# Patient Record
Sex: Female | Born: 1975 | Race: White | Hispanic: No | Marital: Married | Smoking: Former smoker
Health system: Southern US, Community
[De-identification: ages and names within clinical notes are randomized; demographics above are authoritative.]

## PROBLEM LIST (undated history)

## (undated) DIAGNOSIS — N63 Unspecified lump in unspecified breast: Secondary | ICD-10-CM

## (undated) DIAGNOSIS — N6009 Solitary cyst of unspecified breast: Secondary | ICD-10-CM

## (undated) DIAGNOSIS — K649 Unspecified hemorrhoids: Secondary | ICD-10-CM

## (undated) DIAGNOSIS — F419 Anxiety disorder, unspecified: Secondary | ICD-10-CM

## (undated) DIAGNOSIS — I10 Essential (primary) hypertension: Secondary | ICD-10-CM

## (undated) DIAGNOSIS — Z780 Asymptomatic menopausal state: Secondary | ICD-10-CM

## (undated) DIAGNOSIS — N809 Endometriosis, unspecified: Secondary | ICD-10-CM

## (undated) DIAGNOSIS — G43909 Migraine, unspecified, not intractable, without status migrainosus: Secondary | ICD-10-CM

## (undated) HISTORY — DX: Unspecified hemorrhoids: K64.9

## (undated) HISTORY — DX: Unspecified lump in unspecified breast: N63.0

## (undated) HISTORY — DX: Anxiety disorder, unspecified: F41.9

## (undated) HISTORY — DX: Endometriosis, unspecified: N80.9

## (undated) HISTORY — DX: Migraine, unspecified, not intractable, without status migrainosus: G43.909

## (undated) HISTORY — DX: Asymptomatic menopausal state: Z78.0

## (undated) HISTORY — DX: Solitary cyst of unspecified breast: N60.09

## (undated) HISTORY — DX: Essential (primary) hypertension: I10

---

## 1993-09-15 HISTORY — PX: MANDIBLE FRACTURE SURGERY: SHX706

## 2004-09-15 HISTORY — PX: TOTAL ABDOMINAL HYSTERECTOMY W/ BILATERAL SALPINGOOPHORECTOMY: SHX83

## 2004-09-15 HISTORY — PX: ABDOMINAL HYSTERECTOMY: SHX81

## 2004-09-15 HISTORY — PX: LAPAROSCOPY: SHX197

## 2004-10-22 ENCOUNTER — Ambulatory Visit: Payer: Self-pay | Admitting: Unknown Physician Specialty

## 2005-01-16 ENCOUNTER — Inpatient Hospital Stay: Payer: Self-pay | Admitting: Unknown Physician Specialty

## 2005-09-30 ENCOUNTER — Ambulatory Visit: Payer: Self-pay

## 2007-02-02 ENCOUNTER — Ambulatory Visit: Payer: Self-pay | Admitting: Family Medicine

## 2007-07-21 ENCOUNTER — Ambulatory Visit: Payer: Self-pay | Admitting: Internal Medicine

## 2008-11-13 ENCOUNTER — Ambulatory Visit: Payer: Self-pay | Admitting: General Surgery

## 2009-09-15 HISTORY — PX: BREAST CYST ASPIRATION: SHX578

## 2009-11-20 ENCOUNTER — Ambulatory Visit: Payer: Self-pay | Admitting: General Surgery

## 2010-09-15 DEATH — deceased

## 2011-06-25 DIAGNOSIS — E28319 Asymptomatic premature menopause: Secondary | ICD-10-CM

## 2011-06-25 DIAGNOSIS — Z780 Asymptomatic menopausal state: Secondary | ICD-10-CM

## 2011-06-25 HISTORY — DX: Asymptomatic premature menopause: E28.319

## 2011-06-25 HISTORY — DX: Asymptomatic menopausal state: Z78.0

## 2012-06-23 ENCOUNTER — Ambulatory Visit: Payer: Self-pay | Admitting: Family Medicine

## 2013-03-15 HISTORY — PX: HEMORRHOID BANDING: SHX5850

## 2013-06-23 ENCOUNTER — Encounter: Payer: Self-pay | Admitting: *Deleted

## 2013-07-12 ENCOUNTER — Encounter: Payer: Self-pay | Admitting: *Deleted

## 2013-07-14 ENCOUNTER — Ambulatory Visit: Payer: Self-pay | Admitting: General Surgery

## 2013-07-26 ENCOUNTER — Encounter: Payer: Self-pay | Admitting: General Surgery

## 2013-07-26 ENCOUNTER — Ambulatory Visit (INDEPENDENT_AMBULATORY_CARE_PROVIDER_SITE_OTHER): Payer: 59 | Admitting: General Surgery

## 2013-07-26 VITALS — BP 130/90 | HR 68 | Resp 14 | Ht 66.0 in | Wt 150.0 lb

## 2013-07-26 DIAGNOSIS — IMO0002 Reserved for concepts with insufficient information to code with codable children: Secondary | ICD-10-CM | POA: Insufficient documentation

## 2013-07-26 DIAGNOSIS — K649 Unspecified hemorrhoids: Secondary | ICD-10-CM

## 2013-07-26 DIAGNOSIS — K6289 Other specified diseases of anus and rectum: Secondary | ICD-10-CM | POA: Insufficient documentation

## 2013-07-26 LAB — POC HEMOCCULT BLD/STL (OFFICE/1-CARD/DIAGNOSTIC)

## 2013-07-26 NOTE — Progress Notes (Signed)
Patient ID: Denise Hobbs, female   DOB: December 25, 1975, 37 y.o.   MRN: [id number]  Chief Complaint  Patient presents with  . Other    new patient evaluation of hemorrhoids    HPI Denise Hobbs is a 37 y.o. female here today for a evaluation of hemorrhoids. Patients states they have been there for about nine years.She had hemorrhoid banding surgery in July 2014 completed by Renda Rolls, MD. She states there is no bleeding but pain. The patient reports occasionally she'll feel a hard nodule the size of a marble anterior to the anus. In the last few months she has experienced significant discomfort with sexual intercourse. No anal trauma.  HPI  Past Medical History  Diagnosis Date  . Hemorrhoids     Past Surgical History  Procedure Laterality Date  . Abdominal hysterectomy  2006    total  . Hemorrhoid banding  July 2014  . Breast cyst aspiration  2011    History reviewed. No pertinent family history.  Social History History  Substance Use Topics  . Smoking status: Former Smoker -- 20 years  . Smokeless tobacco: Never Used  . Alcohol Use: Yes    No Known Allergies  Current Outpatient Prescriptions  Medication Sig Dispense Refill  . BIOTIN PO Take 1 tablet by mouth daily.      . Calcium Carbonate (CALCIUM 600 PO) Take 1 tablet by mouth daily.      . clonazePAM (KLONOPIN) 0.5 MG tablet Take 1 tablet by mouth daily.      Marland Kitchen estradiol (ESTRACE) 2 MG tablet Take 1 mg by mouth every other day.      . lamoTRIgine (LAMICTAL) 100 MG tablet Take 1 tablet by mouth daily.      . Misc Natural Products (OSTEO BI-FLEX JOINT SHIELD PO) Take 2 tablets by mouth daily.      . Multiple Vitamin (MULTIVITAMIN) tablet Take 1 tablet by mouth daily.      . Omega-3 Fatty Acids (FISH OIL) 600 MG CAPS Take 1 capsule by mouth daily.      . pantoprazole (PROTONIX) 40 MG tablet Take 1 tablet by mouth daily.       No current facility-administered medications for this visit.    Review of Systems Review of  Systems  Constitutional: Negative.   Respiratory: Negative.   Cardiovascular: Negative.     Blood pressure 130/90, pulse 68, resp. rate 14, height 5\' 6"  (1.676 m), weight 150 lb (68.04 kg).  When the patient was evaluated in April 2011 regarding mastalgia her weight was 192 pounds.  Physical Exam Physical Exam  Constitutional: She is oriented to person, place, and time. She appears well-developed and well-nourished.  Neck: No thyromegaly present.  Cardiovascular: Normal rate, regular rhythm and normal heart sounds.   No murmur heard. Pulmonary/Chest: Effort normal and breath sounds normal.  Lymphadenopathy:    She has no cervical adenopathy.  Neurological: She is alert and oriented to person, place, and time.  Skin: Skin is warm and dry.    Data Reviewed PCP notes of December 09, 2012 [facility] spotted fever or titer negative, lyme titer negative. Negative urinalysis.  Assessment    Constipation.  Dyspareunia     Plan    It is difficult to explain her painful intercourse based on today's clinical exam. She is due (overdue) for a GYN evaluation. She was encouraged to have this done in the near future to be sure that a primary vaginal source for her pain is not present.  She has seen Annamarie Major, M.D. In the past.  She has ongoing constipation, and its possible that straining at stool is aggravating her perineal discomfort. The importance of making use of a fiber supplement was encouraged. She reports the previous use of one stool softener resulted in diarrhea. She may have been making use of a product that had a laxative included. She'll check to see if this was the case.  There is nothing to suggest any residual internal hemorrhoids on digital rectal exam. The very small amount of redundant skin evident today does not show any evidence of clot related to a thrombosed external hemorrhoid. The perineal skin is unremarkable.  The patient will contact the office if her symptoms  persist after the establishment of regular soft stools with the use of fiber supplements.       Denise Hobbs 07/26/2013, 8:35 PM

## 2013-07-26 NOTE — Patient Instructions (Addendum)
Patient advised to use a stool softner on a regular basis. She is also advised she can use a fiber supplement to help with her bowels. Patient to follow up with her OB GYN. Patient to return as needed. Patient to call with any new questions or concerns.

## 2013-11-04 ENCOUNTER — Encounter: Payer: Self-pay | Admitting: Cardiovascular Disease

## 2013-11-04 ENCOUNTER — Ambulatory Visit (INDEPENDENT_AMBULATORY_CARE_PROVIDER_SITE_OTHER): Payer: 59 | Admitting: Cardiovascular Disease

## 2013-11-04 ENCOUNTER — Encounter (INDEPENDENT_AMBULATORY_CARE_PROVIDER_SITE_OTHER): Payer: Self-pay

## 2013-11-04 VITALS — BP 147/83 | HR 58 | Ht 66.0 in | Wt 152.2 lb

## 2013-11-04 VITALS — BP 140/70 | HR 72 | Ht 66.0 in | Wt 152.2 lb

## 2013-11-04 DIAGNOSIS — R079 Chest pain, unspecified: Secondary | ICD-10-CM

## 2013-11-04 DIAGNOSIS — R000 Tachycardia, unspecified: Secondary | ICD-10-CM

## 2013-11-04 DIAGNOSIS — IMO0001 Reserved for inherently not codable concepts without codable children: Secondary | ICD-10-CM

## 2013-11-04 DIAGNOSIS — R030 Elevated blood-pressure reading, without diagnosis of hypertension: Secondary | ICD-10-CM

## 2013-11-04 DIAGNOSIS — R0602 Shortness of breath: Secondary | ICD-10-CM

## 2013-11-04 NOTE — Procedures (Signed)
   Treadmill Stress test  Indication: Chest pain and dyspnea.  Baseline Data:  Resting EKG shows NSR with rate of 66 bpm, no significant ST or T wave changes. Resting blood pressure of 147/83 mm Hg Stand bruce protocal was used.  Exercise Data:  Patient exercised for 9 min 0 sec,  Peak heart rate of 155 bpm.  This was 86 % of the maximum predicted heart rate. No symptoms of chest pain or lightheadedness were reported at peak stress or in recovery.  Peak Blood pressure recorded was 155/80 Maximal work level: 10.1 METs.  Heart rate at 3 minutes in recovery was 84 bpm. BP response: Normal HR response: Normal  EKG with Exercise: Sinus tachycardia with 0.5-1 mm of upsloping ST depression in the inferior leads which resolved quickly in recovery.  FINAL IMPRESSION: Normal exercise stress test. No significant EKG changes concerning for ischemia. Good exercise tolerance.  Recommendation: She had no inducible chest pain with exercise. The chest pain seems to be noncardiac.

## 2013-11-04 NOTE — Patient Instructions (Signed)
Normal Stress test

## 2013-11-04 NOTE — Patient Instructions (Signed)
Your physician has requested that you have an exercise tolerance test. For further information please visit [email or web address]. Please also follow instruction sheet, as given.  Your physician has requested that you have a chest x ray at [facility].   Your physician recommends that you schedule a follow-up appointment in:  As needed

## 2013-11-04 NOTE — Assessment & Plan Note (Addendum)
The left-sided chest soreness seems to be musculoskeletal in etiology. However, she also describes substernal exertional tightness and shortness of breath. Cardiac exam reveals no significant abnormalities. Baseline ECG is normal. I proceeded with a treadmill stress test. She was able to exercise for 9 minutes with no reported chest pain. She had mainly borderline upsloping ST depression which was nondiagnostic for ischemia. The stress test overall was normal. I will obtain a chest x-ray. Followup as needed.

## 2013-11-04 NOTE — Assessment & Plan Note (Signed)
She reports intermittent history of elevated blood pressure. She used to take an antihypertensive medication in the past. I advised her to follow lifestyle changes and continue to monitor for now.

## 2013-11-04 NOTE — Progress Notes (Signed)
Primary care physician: Dr. Kary Kos  HPI   This is a pleasant 38 year old female who is self-referred for evaluation of chest pain. This started a few months ago and has been intermittent. She reports a tender spot on the left side of the chest. This is usually worse with touch. This is random and usually happens at rest. She describes a different kind of substernal tightness and shortness of breath with physical activities which has been progressive over the last few months and has limited her ability to exercise. She feels that she cannot take a deep breath. She denies any recent viral tenderness or respiratory tract infection. There is no orthopnea, PND or lower extremity edema. She denies any cough. She quit smoking 4 years ago and reports history of borderline hypertension. There is no family history of premature coronary artery disease.  No Known Allergies   Current Outpatient Prescriptions on File Prior to Visit  Medication Sig Dispense Refill  . BIOTIN PO Take 1 tablet by mouth daily.      . Calcium Carbonate (CALCIUM 600 PO) Take 1 tablet by mouth daily.      . clonazePAM (KLONOPIN) 0.5 MG tablet Take 1 tablet by mouth as needed.       Marland Kitchen estradiol (ESTRACE) 2 MG tablet Take 1 mg by mouth every other day.      . lamoTRIgine (LAMICTAL) 100 MG tablet Take 1 tablet by mouth daily.      . Misc Natural Products (OSTEO BI-FLEX JOINT SHIELD PO) Take 2 tablets by mouth daily.      . Multiple Vitamin (MULTIVITAMIN) tablet Take 1 tablet by mouth daily.      . Omega-3 Fatty Acids (FISH OIL) 600 MG CAPS Take 1 capsule by mouth daily.      . pantoprazole (PROTONIX) 40 MG tablet Take 1 tablet by mouth daily.       No current facility-administered medications on file prior to visit.     Past Medical History  Diagnosis Date  . Hemorrhoids   . Hypertension      Past Surgical History  Procedure Laterality Date  . Abdominal hysterectomy  2006    total  . Hemorrhoid banding  July 2014  .  Breast cyst aspiration  2011     Family History  Problem Relation Age of Onset  . Hypertension Mother   . Hypertension Father      History   Social History  . Marital Status: Married    Spouse Name: N/A    Number of Children: N/A  . Years of Education: N/A   Occupational History  . Not on file.   Social History Main Topics  . Smoking status: Former Smoker -- 20 years    Types: Cigarettes  . Smokeless tobacco: Never Used  . Alcohol Use: Yes     Comment: socially  . Drug Use: No  . Sexual Activity: Not on file   Other Topics Concern  . Not on file   Social History Narrative  . No narrative on file     ROS A 10 point review of system was performed. It is negative other than that mentioned in the history of present illness.   PHYSICAL EXAM   BP 147/83  Pulse 58  Ht 5' 6"  (1.676 m)  Wt 152 lb 4 oz (69.06 kg)  BMI 24.59 kg/m2 Constitutional: She is oriented to person, place, and time. She appears well-developed and well-nourished. No distress.  HENT: No nasal discharge.  Head: Normocephalic  and atraumatic.  Eyes: Pupils are equal and round. No discharge.  Neck: Normal range of motion. Neck supple. No JVD present. No thyromegaly present.  Cardiovascular: Normal rate, regular rhythm, normal heart sounds. Exam reveals no gallop and no friction rub. No murmur heard.  Pulmonary/Chest: Effort normal and breath sounds normal. No stridor. No respiratory distress. She has no wheezes. She has no rales. She exhibits no tenderness.  Abdominal: Soft. Bowel sounds are normal. She exhibits no distension. There is no tenderness. There is no rebound and no guarding.  Musculoskeletal: Normal range of motion. She exhibits no edema and no tenderness.  Neurological: She is alert and oriented to person, place, and time. Coordination normal.  Skin: Skin is warm and dry. No rash noted. She is not diaphoretic. No erythema. No pallor.  Psychiatric: She has a normal mood and affect. Her  behavior is normal. Judgment and thought content normal.     YVO:PFYTW  Bradycardia  WITHIN NORMAL LIMITS   ASSESSMENT AND PLAN

## 2014-07-17 ENCOUNTER — Encounter: Payer: Self-pay | Admitting: Cardiovascular Disease

## 2014-07-27 ENCOUNTER — Ambulatory Visit: Payer: 59 | Admitting: General Surgery

## 2014-08-15 ENCOUNTER — Ambulatory Visit: Payer: 59 | Admitting: General Surgery

## 2014-08-30 ENCOUNTER — Encounter: Payer: Self-pay | Admitting: *Deleted

## 2015-01-16 ENCOUNTER — Other Ambulatory Visit: Payer: Self-pay | Admitting: Unknown Physician Specialty

## 2015-01-16 DIAGNOSIS — G8929 Other chronic pain: Secondary | ICD-10-CM

## 2015-01-16 DIAGNOSIS — R12 Heartburn: Secondary | ICD-10-CM

## 2015-01-16 DIAGNOSIS — R1013 Epigastric pain: Principal | ICD-10-CM

## 2015-01-23 ENCOUNTER — Other Ambulatory Visit: Payer: Self-pay | Admitting: Nurse Practitioner

## 2015-01-23 DIAGNOSIS — R112 Nausea with vomiting, unspecified: Secondary | ICD-10-CM

## 2015-01-31 ENCOUNTER — Ambulatory Visit: Payer: Self-pay

## 2015-02-01 ENCOUNTER — Encounter
Admission: RE | Admit: 2015-02-01 | Discharge: 2015-02-01 | Disposition: A | Discharge status: Home / Self Care | Payer: 59 | Source: Ambulatory Visit | Attending: Nurse Practitioner | Admitting: Nurse Practitioner

## 2015-02-01 ENCOUNTER — Ambulatory Visit
Admission: RE | Admit: 2015-02-01 | Discharge: 2015-02-01 | Disposition: A | Discharge status: Home / Self Care | Payer: 59 | Source: Ambulatory Visit | Attending: Nurse Practitioner | Admitting: Nurse Practitioner

## 2015-02-01 DIAGNOSIS — R112 Nausea with vomiting, unspecified: Secondary | ICD-10-CM

## 2015-02-01 DIAGNOSIS — R1013 Epigastric pain: Secondary | ICD-10-CM | POA: Diagnosis present

## 2015-02-06 ENCOUNTER — Ambulatory Visit: Admission: RE | Admit: 2015-02-06 | Payer: 59 | Source: Ambulatory Visit

## 2015-10-09 LAB — HM MAMMOGRAPHY

## 2015-10-09 LAB — HM PAP SMEAR: HM PAP: NEGATIVE

## 2016-07-24 ENCOUNTER — Encounter: Payer: Self-pay | Admitting: Neurology

## 2016-07-24 ENCOUNTER — Ambulatory Visit (INDEPENDENT_AMBULATORY_CARE_PROVIDER_SITE_OTHER): Payer: 59 | Admitting: Neurology

## 2016-07-24 ENCOUNTER — Other Ambulatory Visit: Payer: Self-pay

## 2016-07-24 VITALS — BP 121/80 | HR 61 | Ht 66.0 in | Wt 149.0 lb

## 2016-07-24 DIAGNOSIS — IMO0002 Reserved for concepts with insufficient information to code with codable children: Secondary | ICD-10-CM

## 2016-07-24 DIAGNOSIS — Z87828 Personal history of other (healed) physical injury and trauma: Secondary | ICD-10-CM

## 2016-07-24 DIAGNOSIS — R202 Paresthesia of skin: Secondary | ICD-10-CM

## 2016-07-24 DIAGNOSIS — G43709 Chronic migraine without aura, not intractable, without status migrainosus: Secondary | ICD-10-CM | POA: Insufficient documentation

## 2016-07-24 MED ORDER — RIZATRIPTAN BENZOATE 5 MG PO TBDP: 5.0000 mg | ORAL_TABLET | ORAL | 6 refills | Status: DC | PRN

## 2016-07-24 MED ORDER — NORTRIPTYLINE HCL 10 MG PO CAPS: 20.0000 mg | ORAL_CAPSULE | Freq: Every day | ORAL | 11 refills | Status: DC

## 2016-07-24 NOTE — Progress Notes (Signed)
PATIENT: Denise Hobbs DOB: Jan 05, 1976  Chief Complaint  Patient presents with  . Rm 4  . New Patient (Initial Visit)  . Migraine    C/o headaches w/ twitching of R eye and face. sees flashes of light on the right side w/ migraines.  . Numbness    Intermittent tingling down both arms.  . Eye Problem    Vision: L 20/40, R 20/30 w/ contact lenses.     HISTORICAL  Denise Hobbs is a 40 years old right-handed female, seen in refer by her primary care physician Dr. Maryland Pink, MD, , and optometrist [employer] for evaluation of frequent headaches  She reported a history of migraine headaches since age 62, her typical migraine are usually preceded by visual aura flashing light in her right visual field followed by right side severe pounding headache with associated light noise sensitivity, nauseous, it can last for a few hours to one day, previously she has tried Imitrex tablet and nasal spray, she has significant side effect without helping her symptoms, she is now taking over-the-counter NSAIDs as needed,  She reported a history of minor brain trauma in the past, since 2016, she noticed increased frequency of headaches, 3 times each week she has mild to moderate headache, has been taking 2 tablets of Advil, she also complains of right lateral hip area pain, she does exercise regularly, sometimes exertion, red wine, will trigger visual aura, she has her typical prolonged migraine about once a month.  She also noticed frequent muscle twitching and the bottom of her right eyelid, she had a history of anxiety currently taking lamotrigine 200 mg every morning, she complains of intermittent bilateral arm paresthesia, left worse than right.  REVIEW OF SYSTEMS: Full 14 system review of systems performed and notable only for fatigue, chest pain, palpitation, ringing ears, rash, blurred vision, eye pain, shortness of breath, cough, snoring, memory loss, headaches, numbness, dizziness,  insomnia, sleepiness, anxiety not enough sleep, decreased energy, racing thoughts.    ALLERGIES: Allergies  Allergen Reactions  . Amoxicillin-Pot Clavulanate Nausea Only    HOME MEDICATIONS: Current Outpatient Prescriptions  Medication Sig Dispense Refill  . BIOTIN PO Take 1 tablet by mouth daily.    . Calcium Carbonate (CALCIUM 600 PO) Take 1 tablet by mouth daily.    . clonazePAM (KLONOPIN) 0.5 MG tablet Take 1 tablet by mouth as needed.     Marland Kitchen estradiol (ESTRACE) 2 MG tablet Take 1 mg by mouth every other day.    . lamoTRIgine (LAMICTAL) 100 MG tablet Take 1 tablet by mouth daily.    . Misc Natural Products (OSTEO BI-FLEX JOINT SHIELD PO) Take 2 tablets by mouth daily.    . Multiple Vitamin (MULTIVITAMIN) tablet Take 1 tablet by mouth daily.     No current facility-administered medications for this visit.     PAST MEDICAL HISTORY: Past Medical History:  Diagnosis Date  . Anxiety   . Hemorrhoids   . Hypertension   . Migraine     PAST SURGICAL HISTORY: Past Surgical History:  Procedure Laterality Date  . ABDOMINAL HYSTERECTOMY  2006   total  . BREAST CYST ASPIRATION  2011  . HEMORRHOID BANDING  July 2014  . MANDIBLE FRACTURE SURGERY  1995    FAMILY HISTORY: Family History  Problem Relation Age of Onset  . Hypertension Mother   . Hypertension Father   . Esophageal cancer Maternal Grandfather   . Parkinson's disease Paternal Grandfather     SOCIAL HISTORY:  Social History   Social History  . Marital status: Married    Spouse name: N/A  . Number of children: 2  . Years of education: 13   Occupational History  . [employer]    Social History Main Topics  . Smoking status: Former Smoker    Years: 20.00    Types: Cigarettes    Quit date: 2010  . Smokeless tobacco: Never Used  . Alcohol use Yes     Comment: socially  . Drug use: No  . Sexual activity: Not on file     Comment: Married   Other Topics Concern  . Not on file   Social History Narrative     Lives at home w/ her husband and 1 child   Right-handed   Caffeine: cup of coffee each morning        PHYSICAL EXAM   Vitals:   07/24/16 1007  BP: 121/80  Pulse: 61  Weight: 149 lb (67.6 kg)  Height: 5' 6"  (1.676 m)    Not recorded      Body mass index is 24.05 kg/m.  PHYSICAL EXAMNIATION:  Gen: NAD, conversant, well nourised, obese, well groomed                     Cardiovascular: Regular rate rhythm, no peripheral edema, warm, nontender. Eyes: Conjunctivae clear without exudates or hemorrhage Neck: Supple, no carotid bruits. Pulmonary: Clear to auscultation bilaterally   NEUROLOGICAL EXAM:  MENTAL STATUS: Speech:    Speech is normal; fluent and spontaneous with normal comprehension.  Cognition:     Orientation to time, place and person     Normal recent and remote memory     Normal Attention span and concentration     Normal Language, naming, repeating,spontaneous speech     Fund of knowledge   CRANIAL NERVES: CN II: Visual fields are full to confrontation. Fundoscopic exam is normal with sharp discs and no vascular changes. Pupils are round equal and briskly reactive to light. CN III, IV, VI: extraocular movement are normal. No ptosis. CN V: Facial sensation is intact to pinprick in all 3 divisions bilaterally. Corneal responses are intact.  CN VII: Face is symmetric with normal eye closure and smile. CN VIII: Hearing is normal to rubbing fingers CN IX, X: Palate elevates symmetrically. Phonation is normal. CN XI: Head turning and shoulder shrug are intact CN XII: Tongue is midline with normal movements and no atrophy.  MOTOR: There is no pronator drift of out-stretched arms. Muscle bulk and tone are normal. Muscle strength is normal.  REFLEXES: Reflexes are 2+ and symmetric at the biceps, triceps, knees, and ankles. Plantar responses are flexor.  SENSORY: Intact to light touch, pinprick, positional sensation and vibratory sensation are intact in  fingers and toes.  COORDINATION: Rapid alternating movements and fine finger movements are intact. There is no dysmetria on finger-to-nose and heel-knee-shin.    GAIT/STANCE: Posture is normal. Gait is steady with normal steps, base, arm swing, and turning. Heel and toe walking are normal. Tandem gait is normal.  Romberg is absent.   DIAGNOSTIC DATA (LABS, IMAGING, TESTING) - I reviewed patient records, labs, notes, testing and imaging myself where available.   ASSESSMENT AND PLAN  Lejla Moeser is a 40 y.o. female   Chronic migraine with aura Depression anxiety,   add on nortriptyline 10 mg titrating to 20 mg every night as preventative medications  Maxalt as needed   She also complains of intermittent bilateral hands paresthesia,  Proceed with MRI of the brain without contrast   Marcial Pacas, M.D. Ph.D.  [facility] [address], [place], [place] [address] Ph: [phone] Fax: [phone]  CC: Maryland Pink, MD, , optometrist [employer]

## 2016-08-12 ENCOUNTER — Telehealth: Payer: Self-pay | Admitting: Neurology

## 2016-08-12 DIAGNOSIS — G4482 Headache associated with sexual activity: Secondary | ICD-10-CM

## 2016-08-12 NOTE — Telephone Encounter (Signed)
Thank you, I called [employer] and switched the order to the MRA brain wo contrast and it was approved.

## 2016-08-12 NOTE — Telephone Encounter (Signed)
I called [employer] to check the status on the request and she stated that [facility] suggested study change to 70544 MRA Head wo contrast or 70546 MRA Head w/wo contrast. Or can do a peer to peer the phone number is [phone] select option 3 and the case number is [id number].

## 2016-08-12 NOTE — Telephone Encounter (Signed)
We have changed the order to MRA brain without contrast

## 2016-08-20 ENCOUNTER — Ambulatory Visit (INDEPENDENT_AMBULATORY_CARE_PROVIDER_SITE_OTHER): Payer: 59

## 2016-08-20 DIAGNOSIS — G4482 Headache associated with sexual activity: Secondary | ICD-10-CM | POA: Diagnosis not present

## 2016-08-22 ENCOUNTER — Telehealth: Payer: Self-pay | Admitting: *Deleted

## 2016-08-22 NOTE — Telephone Encounter (Signed)
Per Dr Krista Blue, spoke with patient and informed her that her MRA brain results are normal. She verbalized understanding, appreciation of call.

## 2016-10-28 ENCOUNTER — Ambulatory Visit: Payer: 59 | Admitting: Neurology

## 2016-12-08 ENCOUNTER — Telehealth: Payer: Self-pay | Admitting: Obstetrics and Gynecology

## 2016-12-08 ENCOUNTER — Other Ambulatory Visit: Payer: Self-pay | Admitting: Obstetrics and Gynecology

## 2016-12-08 MED ORDER — ESTRADIOL 1 MG PO TABS: 1.0000 mg | ORAL_TABLET | Freq: Every day | ORAL | 1 refills | Status: DC

## 2016-12-08 NOTE — Telephone Encounter (Signed)
Please advise for refill. Thank you.

## 2016-12-08 NOTE — Telephone Encounter (Signed)
Pt is schedule with Alicia Copland for annual 01/22/17. Pt is requesting an Refill on her prescription estradiol 1 mg to [facility] in [place]. Pt is an former Health and safety inspector pt.

## 2016-12-08 NOTE — Telephone Encounter (Signed)
Done

## 2016-12-10 NOTE — Telephone Encounter (Signed)
Prescription not authorized. Pt need's annual exam. [facility] No longer here.

## 2017-01-22 ENCOUNTER — Ambulatory Visit (INDEPENDENT_AMBULATORY_CARE_PROVIDER_SITE_OTHER): Payer: 59 | Admitting: Obstetrics and Gynecology

## 2017-01-22 ENCOUNTER — Encounter: Payer: Self-pay | Admitting: Obstetrics and Gynecology

## 2017-01-22 VITALS — BP 120/70 | HR 59 | Ht 66.0 in | Wt 152.0 lb

## 2017-01-22 DIAGNOSIS — Z1239 Encounter for other screening for malignant neoplasm of breast: Secondary | ICD-10-CM

## 2017-01-22 DIAGNOSIS — Z79890 Hormone replacement therapy (postmenopausal): Secondary | ICD-10-CM

## 2017-01-22 DIAGNOSIS — Z124 Encounter for screening for malignant neoplasm of cervix: Secondary | ICD-10-CM

## 2017-01-22 DIAGNOSIS — Z1231 Encounter for screening mammogram for malignant neoplasm of breast: Secondary | ICD-10-CM

## 2017-01-22 DIAGNOSIS — Z01419 Encounter for gynecological examination (general) (routine) without abnormal findings: Secondary | ICD-10-CM

## 2017-01-22 DIAGNOSIS — Z1151 Encounter for screening for human papillomavirus (HPV): Secondary | ICD-10-CM | POA: Diagnosis not present

## 2017-01-22 MED ORDER — ESTRADIOL 1 MG PO TABS: 1.0000 mg | ORAL_TABLET | Freq: Every day | ORAL | 12 refills | Status: DC

## 2017-01-22 NOTE — Progress Notes (Signed)
Chief Complaint  Patient presents with  . Gynecologic Exam     HPI:      [name] is a 41 y.o. [id number] who LMP was No LMP recorded. Patient has had a hysterectomy., presents today for her annual examination.  Her menses are absent due to hyst in 2006 for endometriosis. She does not have intermenstrual bleeding. She takes estradiol 7m daily for ERT. She denies any vasomotor sx if on ERT.  Sex activity: single partner, contraception - status post hysterectomy. No vag dryness on ERT. Vaginitis sx from 10/17 resolved. Last Pap: October 09, 2015  Results were: no abnormalities ; neg HPV DNA 04/2014 Hx of STDs: none  Last mammogram: October 09, 2015  Results were: normal--routine follow-up in 12 months There is no FH of breast cancer. There is no FH of ovarian cancer. The patient does do self-breast exams.  Tobacco use: The patient denies current or previous tobacco use. Alcohol use: social drinker Exercise: moderately active  She does get adequate calcium and Vitamin D in her diet.  She had normal lipids/CMP/thyroid labs 2017.  Past Medical History:  Diagnosis Date  . Anxiety   . Breast mass in female   . Endometriosis   . Hemorrhoids   . Hypertension   . Menopause 06/25/2011  . Migraine     Past Surgical History:  Procedure Laterality Date  . ABDOMINAL HYSTERECTOMY  2006   TAHBSO due to endometriosis  . BREAST CYST ASPIRATION  2011  . HEMORRHOID BANDING  July 2014  . LAPAROSCOPY  2006  . MANDIBLE FRACTURE SURGERY  1995    Family History  Problem Relation Age of Onset  . Hypertension Mother   . Hypertension Father   . Rheum arthritis Father   . Esophageal cancer Maternal Grandfather 60  . Parkinson's disease Paternal Grandfather     Social History   Social History  . Marital status: Married    Spouse name: N/A  . Number of children: 2  . 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  Smoking status: Former Smoker    Years: 20.00    Types: Cigarettes    Quit date: 2010  . Smokeless tobacco: Never Used  . Alcohol use Yes     Comment: socially  . Drug use: No  . Sexual activity: Yes    Partners: Male    Birth control/ protection: Surgical     Comment: Married   Other Topics Concern  . Not on file   Social History Narrative   Lives at home w/ her husband and 1 child   Right-handed   Caffeine: cup of coffee each morning        Current Outpatient Prescriptions:  .  BIOTIN PO, Take 1 tablet by mouth daily., Disp: , Rfl:  .  Calcium Carbonate (CALCIUM 600 PO), Take 1 tablet by mouth daily., Disp: , Rfl:  .  clonazePAM (KLONOPIN) 0.5 MG tablet, Take 1 tablet by mouth as needed. , Disp: , Rfl:  .  estradiol (ESTRACE) 1 MG tablet, Take 1 tablet (1 mg total) by mouth daily., Disp: 30 tablet, Rfl: 12 .  lamoTRIgine (LAMICTAL) 100 MG tablet, Take 1 tablet by mouth daily., Disp: , Rfl:  .  lisinopril (PRINIVIL,ZESTRIL) 5 MG tablet, Take by mouth., Disp: , Rfl:  .  Misc Natural Products (OSTEO BI-FLEX JOINT SHIELD PO), Take 2 tablets by mouth daily.,  Disp: , Rfl:  .  Multiple Vitamin (MULTIVITAMIN) tablet, Take 1 tablet by mouth daily., Disp: , Rfl:  .  nortriptyline (PAMELOR) 10 MG capsule, Take 2 capsules (20 mg total) by mouth at bedtime., Disp: 60 capsule, Rfl: 11 .  pantoprazole (PROTONIX) 40 MG tablet, , Disp: , Rfl:  .  rizatriptan (MAXALT-MLT) 5 MG disintegrating tablet, Take 1 tablet (5 mg total) by mouth as needed. May repeat in 2 hours if needed, Disp: 15 tablet, Rfl: 6  ROS:  Review of Systems  Constitutional: Negative for fever, malaise/fatigue and weight loss.  HENT: Negative for congestion, ear pain and sinus pain.   Respiratory: Negative for cough, shortness of breath and wheezing.   Cardiovascular: Negative for chest pain, orthopnea and leg swelling.  Gastrointestinal: Negative for constipation, diarrhea, nausea and vomiting.  Genitourinary: Negative  for dysuria, frequency, hematuria and urgency.       Breast ROS: negative   Musculoskeletal: Negative for back pain, joint pain and myalgias.  Skin: Negative for itching and rash.  Neurological: Negative for dizziness, tingling, focal weakness and headaches.  Endo/Heme/Allergies: Negative for environmental allergies. Does not bruise/bleed easily.  Psychiatric/Behavioral: Negative for depression and suicidal ideas. The patient is not nervous/anxious and does not have insomnia.     Objective: BP 120/70   Pulse (!) 59   Ht 5' 6"  (1.676 m)   Wt 152 lb (68.9 kg)   BMI 24.53 kg/m    Physical Exam  Constitutional: She is oriented to person, place, and time. She appears well-developed and well-nourished.  Genitourinary: Vagina normal. There is no rash or tenderness on the right labia. There is no rash or tenderness on the left labia. No erythema or tenderness in the vagina. No vaginal discharge found. Right adnexum does not display mass and does not display tenderness. Left adnexum does not display mass and does not display tenderness.  Genitourinary Comments: UTERUS/CX SURG ABSENT  Neck: Normal range of motion. No thyromegaly present.  Cardiovascular: Normal rate, regular rhythm and normal heart sounds.   No murmur heard. Pulmonary/Chest: Effort normal and breath sounds normal. Right breast exhibits no mass, no nipple discharge, no skin change and no tenderness. Left breast exhibits no mass, no nipple discharge, no skin change and no tenderness.  Abdominal: Soft. There is no tenderness. There is no guarding.  Musculoskeletal: Normal range of motion.  Neurological: She is alert and oriented to person, place, and time. No cranial nerve deficit.  Psychiatric: She has a normal mood and affect. Her behavior is normal.  Vitals reviewed.   Assessment/Plan: Encounter for annual routine gynecological examination  Cervical cancer screening - Plan: IGP, Aptima HPV  Screening for HPV (human  papillomavirus) - Plan: IGP, Aptima HPV  Screening for breast cancer - Pt to sched mammo. - Plan: MM DIGITAL SCREENING BILATERAL  Hormone replacement therapy (HRT) - Rx RF estradiol. F/u prn. - Plan: estradiol (ESTRACE) 1 MG tablet             GYN counsel mammography screening, menopause, osteoporosis, adequate intake of calcium and vitamin D, diet and exercise     F/U  Return in about 1 year (around 01/22/2018).  Jd Mccaster B. Talicia Sui, PA-C 01/22/2017 10:09 AM

## 2017-01-27 LAB — IGP, APTIMA HPV
HPV APTIMA: NEGATIVE
PAP Smear Comment: 0

## 2017-06-03 ENCOUNTER — Telehealth: Payer: Self-pay | Admitting: Pulmonary Disease

## 2017-06-03 NOTE — Telephone Encounter (Signed)
TR was on the phone with another pt  I called the pt and scheduled her consult with PM for 06/05/17 at 10 am She is aware to arrive at 9:45 am

## 2017-06-03 NOTE — Telephone Encounter (Signed)
Sending to TR to schedule appt b/c there is an opening on 9/21

## 2017-06-05 ENCOUNTER — Ambulatory Visit (INDEPENDENT_AMBULATORY_CARE_PROVIDER_SITE_OTHER): Payer: 59 | Admitting: Pulmonary Disease

## 2017-06-05 ENCOUNTER — Encounter: Payer: Self-pay | Admitting: Pulmonary Disease

## 2017-06-05 ENCOUNTER — Other Ambulatory Visit: Payer: 59

## 2017-06-05 ENCOUNTER — Ambulatory Visit (INDEPENDENT_AMBULATORY_CARE_PROVIDER_SITE_OTHER)
Admission: RE | Admit: 2017-06-05 | Discharge: 2017-06-05 | Disposition: A | Discharge status: Home / Self Care | Payer: 59 | Source: Ambulatory Visit | Attending: Pulmonary Disease | Admitting: Pulmonary Disease

## 2017-06-05 VITALS — BP 128/72 | HR 82 | Ht 66.0 in | Wt 156.8 lb

## 2017-06-05 DIAGNOSIS — R0602 Shortness of breath: Secondary | ICD-10-CM

## 2017-06-05 DIAGNOSIS — R059 Cough, unspecified: Secondary | ICD-10-CM

## 2017-06-05 DIAGNOSIS — R05 Cough: Secondary | ICD-10-CM | POA: Diagnosis not present

## 2017-06-05 LAB — NITRIC OXIDE: NITRIC OXIDE: 15

## 2017-06-05 MED ORDER — BUDESONIDE-FORMOTEROL FUMARATE 80-4.5 MCG/ACT IN AERO: 2.0000 | INHALATION_SPRAY | Freq: Two times a day (BID) | RESPIRATORY_TRACT | 5 refills | Status: DC

## 2017-06-05 MED ORDER — ALBUTEROL SULFATE HFA 108 (90 BASE) MCG/ACT IN AERS: 2.0000 | INHALATION_SPRAY | Freq: Four times a day (QID) | RESPIRATORY_TRACT | 6 refills | Status: DC | PRN

## 2017-06-05 MED ORDER — BUDESONIDE-FORMOTEROL FUMARATE 80-4.5 MCG/ACT IN AERO: 2.0000 | INHALATION_SPRAY | Freq: Two times a day (BID) | RESPIRATORY_TRACT | 0 refills | Status: DC

## 2017-06-05 NOTE — Progress Notes (Signed)
Denise Hobbs    [id number]    [birth year]  Primary Care Physician:Hedrick, Jeneen Rinks, MD  Referring Physician: Maryland Pink, MD [address] [facility] [place], [place] [address]  Chief complaint:  Consult for evaluation of dyspnea  HPI: 41 year old with history of hypertension, migraine, allergies, GERD.  She has complains of chest pressure, dyspnea associated with occasional wheezing. This has been going on for several years but worsened over the past year. Symptoms occur at rest too but with lesser intensity. She has been evaluated by Dr.Arida, cardiology in 2015 with a normal stress test. She saw another cardiologist last year at [facility] and was told that she does not have any cardiac issues. She has history of seasonal allergies, sensitivity to ragweed, grass. She also has GERD and is on Protonix.  Pets: She has a ferret, no cats. Her dog died recently. No exposure to birds, exotic animals or farm animals. Occupation: Office work Exposures: No known exposures at work or at home, no mold issues. Smoking history: 20-pack-year smoking history. Quit in 2010  Outpatient Encounter Prescriptions as of 06/05/2017  Medication Sig  . BIOTIN PO Take 1 tablet by mouth daily.  . Calcium Carbonate (CALCIUM 600 PO) Take 1 tablet by mouth daily.  . clonazePAM (KLONOPIN) 0.5 MG tablet Take 1 tablet by mouth as needed.   Marland Kitchen estradiol (ESTRACE) 1 MG tablet Take 1 tablet (1 mg total) by mouth daily.  Marland Kitchen lamoTRIgine (LAMICTAL) 100 MG tablet Take 1 tablet by mouth daily.  Marland Kitchen lisinopril (PRINIVIL,ZESTRIL) 5 MG tablet Take by mouth.  . Misc Natural Products (OSTEO BI-FLEX JOINT SHIELD PO) Take 2 tablets by mouth daily.  . Multiple Vitamin (MULTIVITAMIN) tablet Take 1 tablet by mouth daily.  . nortriptyline (PAMELOR) 10 MG capsule Take 2 capsules (20 mg total) by mouth at bedtime.  . pantoprazole (PROTONIX) 40 MG tablet   . rizatriptan (MAXALT-MLT) 5 MG disintegrating tablet Take 1  tablet (5 mg total) by mouth as needed. May repeat in 2 hours if needed   No facility-administered encounter medications on file as of 06/05/2017.     Allergies as of 06/05/2017 - Review Complete 01/22/2017  Allergen Reaction Noted  . Amoxicillin-pot clavulanate Nausea Only 03/25/2016  . Pollen extract      Past Medical History:  Diagnosis Date  . Anxiety   . Breast mass in female   . Endometriosis   . Hemorrhoids   . Hypertension   . Menopause 06/25/2011  . Migraine     Past Surgical History:  Procedure Laterality Date  . ABDOMINAL HYSTERECTOMY  2006   TAHBSO due to endometriosis  . BREAST CYST ASPIRATION  2011  . HEMORRHOID BANDING  July 2014  . LAPAROSCOPY  2006  . MANDIBLE FRACTURE SURGERY  1995    Family History  Problem Relation Age of Onset  . Hypertension Mother   . Hypertension Father   . Rheum arthritis Father   . Esophageal cancer Maternal Grandfather 60  . Parkinson's disease Paternal Grandfather     Social History   Social History  . Marital status: Married    Spouse name: N/A  . Number of children: 2  . 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 Smoking status: Former Smoker    Years: 20.00    Types: Cigarettes    Quit date: 2010  . Smokeless tobacco: Never  Used  . Alcohol use Yes     Comment: socially  . Drug use: No  . Sexual activity: Yes    Partners: Male    Birth control/ protection: Surgical     Comment: Married   Other Topics Concern  . Not on file   Social History Narrative   Lives at home w/ her husband and 1 child   Right-handed   Caffeine: cup of coffee each morning       Review of systems: Review of Systems  Constitutional: Negative for fever and chills.  HENT: Negative.   Eyes: Negative for blurred vision.  Respiratory: as per HPI  Cardiovascular: Negative for chest pain and palpitations.  Gastrointestinal: Negative for vomiting, diarrhea, blood per  rectum. Genitourinary: Negative for dysuria, urgency, frequency and hematuria.  Musculoskeletal: Negative for myalgias, back pain and joint pain.  Skin: Negative for itching and rash.  Neurological: Negative for dizziness, tremors, focal weakness, seizures and loss of consciousness.  Endo/Heme/Allergies: Negative for environmental allergies.  Psychiatric/Behavioral: Negative for depression, suicidal ideas and hallucinations.  All other systems reviewed and are negative.  Physical Exam: There were no vitals taken for this visit. Gen:      No acute distress HEENT:  EOMI, sclera anicteric Neck:     No masses; no thyromegaly Lungs:    Clear to auscultation bilaterally; normal respiratory effort CV:         Regular rate and rhythm; no murmurs Abd:      + bowel sounds; soft, non-tender; no palpable masses, no distension Ext:    No edema; adequate peripheral perfusion Skin:      Warm and dry; no rash Neuro: alert and oriented x 3 Psych: normal mood and affect  Data Reviewed: Chest x-ray 02/12/2007-no acute cardiopulmonary abnormality. There is no images in the PACS system to review.  FENO 06/05/17- 15  Assessment:  Evaluation for dyspnea with chest pressure on exertion May be reactive airway disease, asthma. However FENO levels are low in office today We evaluate with chest x-ray, CBC with differential, blood allergy profile and full pulmonary function test We will give him trial of Symbicort and albuterol PRN Reevaluate in 1-2 months. If symptoms are persistent and she may need a cardiopulmonary exercise test. Consider reevaluation by cardiology.  GERD Continues on Protonix.  Plan/Recommendations: - CXR, CBC with diff, blood allergy profile - Start symbicort 80/4.5 and albuterol PRN - Continue protonix for GERD  Marshell Garfinkel MD [facility] Pager [phone] 06/05/2017, 9:39 AM  CC: Maryland Pink, MD

## 2017-06-05 NOTE — Patient Instructions (Signed)
We will check a CBC with differential and a blood allergic profile. We will schedule you for chest x-ray and full pulmonary function test Will trial of Symbicort 80/4.5 and an albuterol rescue inhaler.  Return in 1-2 months.

## 2017-06-06 LAB — SPECIMEN STATUS REPORT

## 2017-06-09 LAB — SPECIMEN STATUS REPORT

## 2017-06-10 ENCOUNTER — Telehealth: Payer: Self-pay | Admitting: Pulmonary Disease

## 2017-06-10 DIAGNOSIS — R079 Chest pain, unspecified: Secondary | ICD-10-CM

## 2017-06-10 NOTE — Telephone Encounter (Signed)
PM please advise of the lab results from last week.  Thanks

## 2017-06-11 LAB — OTHER LAB TEST

## 2017-06-11 NOTE — Telephone Encounter (Signed)
CBC is normal. I don't see the results of the blood allergy profile.  Can you locate it?

## 2017-06-11 NOTE — Telephone Encounter (Signed)
Pt returning call.  Also would like a recommendation for cardologist if possible.  [phone]

## 2017-06-11 NOTE — Telephone Encounter (Signed)
Called pt and advised message from the [place]. Pt understood and verbalized understanding. We will await results.

## 2017-06-11 NOTE — Telephone Encounter (Signed)
ATC pt - no answer and pt does not have VM set up  Regarding the RAST > called [employer] and spoke with customer representative Sarah, the order was not received BUT it can be added to the CBCD that pt had done.  IGE is not included in that panel, so this will be a separate test and result.  Results to be faxed to PM.  Will call pt back to discuss CBCD results and also route to PM to make him aware of the RAST.

## 2017-06-12 LAB — CBC WITH DIFFERENTIAL/PLATELET
BASOS: 0 %
Basophils Absolute: 0 10*3/uL (ref 0.0–0.2)
EOS (ABSOLUTE): 0.1 10*3/uL (ref 0.0–0.4)
Eos: 1 %
Hematocrit: 40 % (ref 34.0–46.6)
Hemoglobin: 13.7 g/dL (ref 11.1–15.9)
Immature Grans (Abs): 0 10*3/uL (ref 0.0–0.1)
Immature Granulocytes: 0 %
Lymphocytes Absolute: 2.1 10*3/uL (ref 0.7–3.1)
Lymphs: 23 %
MCH: 30.7 pg (ref 26.6–33.0)
MCHC: 34.3 g/dL (ref 31.5–35.7)
MCV: 90 fL (ref 79–97)
MONOS ABS: 0.7 10*3/uL (ref 0.1–0.9)
Monocytes: 7 %
NEUTROS ABS: 6.3 10*3/uL (ref 1.4–7.0)
NEUTROS PCT: 69 %
PLATELETS: 238 10*3/uL (ref 150–379)
RBC: 4.46 x10E6/uL (ref 3.77–5.28)
RDW: 12.7 % (ref 12.3–15.4)
WBC: 9.2 10*3/uL (ref 3.4–10.8)

## 2017-06-12 NOTE — Telephone Encounter (Signed)
That is fine. Please refer to Dr. Einar Gip for evaluation of chest pain. Thanks  Marshell Garfinkel MD  Pulmonary and Critical Care 06/12/2017, 12:34 PM

## 2017-06-12 NOTE — Telephone Encounter (Signed)
Spoke with pt, she stated she would like a referral to Cardiology. I placed a referral to see Dr. Einar Gip.

## 2017-07-01 ENCOUNTER — Institutional Professional Consult (permissible substitution): Payer: 59 | Admitting: Pulmonary Disease

## 2017-07-07 ENCOUNTER — Ambulatory Visit: Payer: 59 | Admitting: Pulmonary Disease

## 2017-07-13 ENCOUNTER — Encounter: Payer: Self-pay | Admitting: Pulmonary Disease

## 2017-07-13 ENCOUNTER — Ambulatory Visit (INDEPENDENT_AMBULATORY_CARE_PROVIDER_SITE_OTHER): Payer: 59 | Admitting: Pulmonary Disease

## 2017-07-13 VITALS — BP 110/72 | HR 60 | Ht 66.0 in | Wt 154.0 lb

## 2017-07-13 DIAGNOSIS — R0602 Shortness of breath: Secondary | ICD-10-CM

## 2017-07-13 DIAGNOSIS — R079 Chest pain, unspecified: Secondary | ICD-10-CM

## 2017-07-13 LAB — PULMONARY FUNCTION TEST
DL/VA % PRED: 93 %
DL/VA: 4.73 ml/min/mmHg/L
DLCO UNC % PRED: 97 %
DLCO UNC: 26.19 ml/min/mmHg
FEF 25-75 POST: 4.26 L/s
FEF 25-75 PRE: 3.89 L/s
FEF2575-%Change-Post: 9 %
FEF2575-%PRED-POST: 135 %
FEF2575-%Pred-Pre: 124 %
FEV1-%CHANGE-POST: 1 %
FEV1-%Pred-Post: 114 %
FEV1-%Pred-Pre: 112 %
FEV1-POST: 3.61 L
FEV1-Pre: 3.55 L
FEV1FVC-%Change-Post: 3 %
FEV1FVC-%PRED-PRE: 102 %
FEV6-%Change-Post: -2 %
FEV6-%PRED-POST: 108 %
FEV6-%Pred-Pre: 110 %
FEV6-Post: 4.14 L
FEV6-Pre: 4.22 L
FEV6FVC-%Pred-Post: 102 %
FEV6FVC-%Pred-Pre: 102 %
FVC-%CHANGE-POST: -1 %
FVC-%PRED-POST: 106 %
FVC-%PRED-PRE: 108 %
FVC-Post: 4.14 L
FVC-Pre: 4.22 L
PRE FEV1/FVC RATIO: 84 %
PRE FEV6/FVC RATIO: 100 %
Post FEV1/FVC ratio: 87 %
Post FEV6/FVC ratio: 100 %
RV % PRED: 98 %
RV: 1.72 L
TLC % pred: 110 %
TLC: 5.92 L

## 2017-07-13 NOTE — Progress Notes (Signed)
PFT done today by Rumeal Cullipher, CMA  

## 2017-07-13 NOTE — Patient Instructions (Signed)
Please continue to use albuterol inhaler as needed and before exercise Your lung function tests look normal. We will refer you to cardiology for further evaluation of your atypical chest pain  Follow-up in 3 months.

## 2017-07-13 NOTE — Progress Notes (Addendum)
Denise Hobbs    [id number]    30-Mar-1976  Primary Care Physician:Hedrick, Jeneen Rinks, MD  Referring Physician: Maryland Pink, MD [address] [facility] [place], [place] [address]  Chief complaint: Follow-up for dyspnea  HPI: 41 year old with history of hypertension, migraine, allergies, GERD.  She has complains of chest pressure, dyspnea associated with occasional wheezing. This has been going on for several years but worsened over the past year. Symptoms occur at rest too but with lesser intensity. She has been evaluated by Dr.Arida, cardiology in 2015 with a normal stress test. She saw another cardiologist last year at [facility] and was told that she does not have any cardiac issues. She has history of seasonal allergies, sensitivity to ragweed, grass. She also has GERD and is on Protonix.  Pets: She has a ferret, no cats. Her dog died recently. No exposure to birds, exotic animals or farm animals. Occupation: Office work Exposures: No known exposures at work or at home, no mold issues. Smoking history: 20-pack-year smoking history. Quit in 2010  Interim history: Dyspnea continues unchanged.  She was given Symbicort at last visit but has not uses on a regular basis.  She is more concerned about her chest tightness and atypical chest pain and is requesting an evaluation by cardiology at [facility].  Outpatient Encounter Prescriptions as of 07/13/2017  Medication Sig  . albuterol (PROVENTIL HFA;VENTOLIN HFA) 108 (90 Base) MCG/ACT inhaler Inhale 2 puffs into the lungs every 6 (six) hours as needed for wheezing or shortness of breath. (Patient not taking: Reported on 07/13/2017)  . BIOTIN PO Take 1 tablet by mouth daily.  . budesonide-formoterol (SYMBICORT) 80-4.5 MCG/ACT inhaler Inhale 2 puffs into the lungs 2 (two) times daily.  . budesonide-formoterol (SYMBICORT) 80-4.5 MCG/ACT inhaler Inhale 2 puffs into the lungs 2 (two) times daily. (Patient not taking: Reported  on 07/13/2017)  . Calcium Carbonate (CALCIUM 600 PO) Take 1 tablet by mouth daily.  . clonazePAM (KLONOPIN) 0.5 MG tablet Take 1 tablet by mouth as needed.   Marland Kitchen estradiol (ESTRACE) 1 MG tablet Take 1 tablet (1 mg total) by mouth daily.  Marland Kitchen lamoTRIgine (LAMICTAL) 100 MG tablet Take 1 tablet by mouth daily.  Marland Kitchen lisinopril (PRINIVIL,ZESTRIL) 5 MG tablet Take by mouth.  . Misc Natural Products (OSTEO BI-FLEX JOINT SHIELD PO) Take 2 tablets by mouth daily.  . Multiple Vitamin (MULTIVITAMIN) tablet Take 1 tablet by mouth daily.  . pantoprazole (PROTONIX) 40 MG tablet   . [DISCONTINUED] nortriptyline (PAMELOR) 10 MG capsule Take 2 capsules (20 mg total) by mouth at bedtime.  . [DISCONTINUED] rizatriptan (MAXALT-MLT) 5 MG disintegrating tablet Take 1 tablet (5 mg total) by mouth as needed. May repeat in 2 hours if needed   No facility-administered encounter medications on file as of 07/13/2017.     Allergies as of 07/13/2017 - Review Complete 06/05/2017  Allergen Reaction Noted  . Amoxicillin-pot clavulanate Nausea Only 03/25/2016  . Pollen extract      Past Medical History:  Diagnosis Date  . Anxiety   . Breast cyst   . Breast mass in female   . Endometriosis   . Hemorrhoids   . Hypertension   . Menopause 06/25/2011  . Migraine     Past Surgical History:  Procedure Laterality Date  . ABDOMINAL HYSTERECTOMY  2006   TAHBSO due to endometriosis  . BREAST CYST ASPIRATION  2011  . HEMORRHOID BANDING  July 2014  . LAPAROSCOPY  2006  .  MANDIBLE FRACTURE SURGERY  1995    Family History  Problem Relation Age of Onset  . Hypertension Mother   . Hypertension Father   . Rheum arthritis Father   . Esophageal cancer Maternal Grandfather 60  . Parkinson's disease Paternal Grandfather     Social History   Social History  . Marital status: Married    Spouse name: N/A  . Number of children: 2  . 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 Smoking status: Former Smoker    Packs/day: 1.00    Years: 20.00    Types: Cigarettes    Quit date: 2010  . Smokeless tobacco: Never Used  . Alcohol use Yes     Comment: socially  . Drug use: No  . Sexual activity: Yes    Partners: Male    Birth control/ protection: Surgical     Comment: Married   Other Topics Concern  . Not on file   Social History Narrative   Lives at home w/ her husband and 1 child   Right-handed   Caffeine: cup of coffee each morning       Review of systems: Review of Systems  Constitutional: Negative for fever and chills.  HENT: Negative.   Eyes: Negative for blurred vision.  Respiratory: as per HPI  Cardiovascular: Negative for chest pain and palpitations.  Gastrointestinal: Negative for vomiting, diarrhea, blood per rectum. Genitourinary: Negative for dysuria, urgency, frequency and hematuria.  Musculoskeletal: Negative for myalgias, back pain and joint pain.  Skin: Negative for itching and rash.  Neurological: Negative for dizziness, tremors, focal weakness, seizures and loss of consciousness.  Endo/Heme/Allergies: Negative for environmental allergies.  Psychiatric/Behavioral: Negative for depression, suicidal ideas and hallucinations.  All other systems reviewed and are negative.  Physical Exam: There were no vitals taken for this visit. Gen:      No acute distress HEENT:  EOMI, sclera anicteric Neck:     No masses; no thyromegaly Lungs:    Clear to auscultation bilaterally; normal respiratory effort CV:         Regular rate and rhythm; no murmurs Abd:      + bowel sounds; soft, non-tender; no palpable masses, no distension Ext:    No edema; adequate peripheral perfusion Skin:      Warm and dry; no rash Neuro: alert and oriented x 3 Psych: normal mood and affect  Data Reviewed: Chest x-ray 02/12/2007-no acute cardiopulmonary abnormality. There is no images in the PACS system to review. Chest x-ray 06/05/17-no acute  cardiopulmonary process I have reviewed the images personally.  FENO 06/05/17- 15  PFTs 07/13/17 FVC 4.14 [106%], FEV1 3.61 (114%], F/F 87, TLC 110%, DLCO 97% Normal PFTs, no bronchodilator response.  Blood test 06/05/17 CBC differential-normal, absolute eosinophil count 100  Assessment:  Evaluation for dyspnea with chest pressure on exertion Suspicion for asthma is low.  Her evaluation included chest x-ray, PFTs and blood tests are unremarkable She is not using the Symbicort as it does not work.  We will continue the albuterol rescue inhaler as needed  Reevaluate in 1-2 months. If symptoms are persistent and she may need a cardiopulmonary exercise test. She is requesting referral to cardiology for evaluation of her atypical chest pain.  GERD Continues on Protonix.  Plan/Recommendations: - Continue albuterol rescue inhaler - Cardiology referral - Continue protonix for GERD  Marshell Garfinkel MD [facility] Pager  [phone] 07/13/2017, 11:37 AM  CC: Maryland Pink, MD   Addendum: Received office note from Dr. Einar Gip dated 07/23/17 She is being scheduled for an echocardiogram and exercise stress test.  Echocardiogram 09/02/17-LV cavity is normal in size, normal wall motion, normal diastolic filling pattern, EF 69% Trace MR, trace TR.  No evidence of pulmonary hypertension.  Marshell Garfinkel MD [facility] Pager [phone] 09/30/2017, 5:15 PM

## 2017-08-05 ENCOUNTER — Encounter: Payer: Self-pay | Admitting: Obstetrics and Gynecology

## 2017-08-05 ENCOUNTER — Ambulatory Visit
Admission: RE | Admit: 2017-08-05 | Discharge: 2017-08-05 | Disposition: A | Discharge status: Home / Self Care | Payer: 59 | Source: Ambulatory Visit | Attending: Obstetrics and Gynecology | Admitting: Obstetrics and Gynecology

## 2017-08-05 DIAGNOSIS — Z1239 Encounter for other screening for malignant neoplasm of breast: Secondary | ICD-10-CM

## 2017-08-05 DIAGNOSIS — Z1231 Encounter for screening mammogram for malignant neoplasm of breast: Secondary | ICD-10-CM | POA: Insufficient documentation

## 2017-08-10 ENCOUNTER — Ambulatory Visit: Payer: 59 | Admitting: Cardiovascular Disease

## 2017-08-20 ENCOUNTER — Ambulatory Visit: Payer: 59

## 2018-03-14 ENCOUNTER — Other Ambulatory Visit: Payer: Self-pay | Admitting: Obstetrics and Gynecology

## 2018-03-14 DIAGNOSIS — Z79890 Hormone replacement therapy (postmenopausal): Secondary | ICD-10-CM

## 2018-03-19 ENCOUNTER — Other Ambulatory Visit: Payer: Self-pay

## 2018-03-19 DIAGNOSIS — Z79890 Hormone replacement therapy (postmenopausal): Secondary | ICD-10-CM

## 2018-03-19 MED ORDER — ESTRADIOL 1 MG PO TABS: 1.0000 mg | ORAL_TABLET | Freq: Every day | ORAL | 0 refills | Status: DC

## 2018-04-06 ENCOUNTER — Encounter: Payer: Self-pay | Admitting: Obstetrics and Gynecology

## 2018-04-06 ENCOUNTER — Ambulatory Visit (INDEPENDENT_AMBULATORY_CARE_PROVIDER_SITE_OTHER): Payer: 59 | Admitting: Obstetrics and Gynecology

## 2018-04-06 VITALS — BP 110/68 | HR 64 | Ht 66.0 in | Wt 157.0 lb

## 2018-04-06 DIAGNOSIS — Z01419 Encounter for gynecological examination (general) (routine) without abnormal findings: Secondary | ICD-10-CM | POA: Diagnosis not present

## 2018-04-06 DIAGNOSIS — Z1231 Encounter for screening mammogram for malignant neoplasm of breast: Secondary | ICD-10-CM | POA: Diagnosis not present

## 2018-04-06 DIAGNOSIS — Z1239 Encounter for other screening for malignant neoplasm of breast: Secondary | ICD-10-CM

## 2018-04-06 DIAGNOSIS — Z79890 Hormone replacement therapy (postmenopausal): Secondary | ICD-10-CM

## 2018-04-06 MED ORDER — ESTRADIOL 1 MG PO TABS: 1.0000 mg | ORAL_TABLET | Freq: Every day | ORAL | 3 refills | Status: DC

## 2018-04-06 NOTE — Patient Instructions (Addendum)
I value your feedback and entrusting [place] with your care. If you get a [facility] patient survey, I would appreciate you taking the time to let us know about your experience today. Thank you!  [place] at [facility]: [phone]

## 2018-04-06 NOTE — Progress Notes (Signed)
Chief Complaint  Patient presents with  . Gynecologic Exam     HPI:      [name] is a 42 y.o. [id number] who LMP was No LMP recorded. Patient has had a hysterectomy., presents today for her annual examination. Her menses are absent due to hyst in 2006 for endometriosis. She does not have intermenstrual bleeding. She takes estradiol 40m daily for ERT. She denies any vasomotor sx if on ERT andn wants to continue.  Sex activity: single partner, contraception - status post hysterectomy. No vag dryness on ERT.  Last Pap: 01/22/17 Results were: no abnormalities ; neg HPV DNA  Hx of STDs: none  Last mammogram: 08/05/17 Results were: normal--routine follow-up in 12 months There is no FH of breast cancer. There is no FH of ovarian cancer. The patient does do self-breast exams.  Tobacco use: The patient denies current or previous tobacco use. Alcohol use: social drinker Exercise: not active  She does get adequate calcium and Vitamin D in her diet.  She had normal lipids/CMP/thyroid labs 2017. Labs with PCP now.  Past Medical History:  Diagnosis Date  . Anxiety   . Breast cyst   . Breast mass in female   . Endometriosis   . Hemorrhoids   . Hypertension   . Menopause 06/25/2011  . Migraine     Past Surgical History:  Procedure Laterality Date  . ABDOMINAL HYSTERECTOMY  2006   TAHBSO due to endometriosis  . BREAST CYST ASPIRATION  2011  . HEMORRHOID BANDING  July 2014  . LAPAROSCOPY  2006  . MANDIBLE FRACTURE SURGERY  1995    Family History  Problem Relation Age of Onset  . Hypertension Mother   . Hypertension Father   . Rheum arthritis Father   . Esophageal cancer Maternal Grandfather 60  . Parkinson's disease Paternal Grandfather   . Breast cancer Neg Hx     Social History   Socioeconomic History  . Marital status: Married    Spouse name: Not on file  . Number of children: 2  . Years of education: 110 . Highest education level: Not on file    Occupational History  . 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 Financial resource strain: Not on file  . Food insecurity:    Worry: Not on file    Inability: Not on file  . Transportation needs:    Medical: Not on file    Non-medical: Not on file  Tobacco Use  . Smoking status: Former Smoker    Packs/day: 1.00    Years: 20.00    Pack years: 20.00    Types: Cigarettes    Last attempt to quit: 2010    Years since quitting: 9.5  . Smokeless tobacco: Never Used  Substance and Sexual Activity  . Alcohol use: Yes    Comment: socially  . Drug use: No  . Sexual activity: Yes    Partners: Male    Birth control/protection: Surgical    Comment: Married  Lifestyle  . Physical activity:    Days per week: Not on file    Minutes per session: Not on file  . Stress: Not on file  Relationships  . Social connections:    Talks on phone: Not on file    Gets together: Not on file    Attends religious service: Not on file    Active member of club or organization: Not on file    Attends meetings  of clubs or organizations: Not on file    Relationship status: Not on file  . Intimate partner violence:    Fear of current or ex partner: Not on file    Emotionally abused: Not on file    Physically abused: Not on file    Forced sexual activity: Not on file  Other Topics Concern  . Not on file  Social History Narrative   Lives at home w/ her husband and 1 child   Right-handed   Caffeine: cup of coffee each morning    Current Outpatient Medications on File Prior to Visit  Medication Sig Dispense Refill  . BIOTIN PO Take 1 tablet by mouth daily.    . Calcium Carbonate (CALCIUM 600 PO) Take 1 tablet by mouth daily.    . clonazePAM (KLONOPIN) 0.5 MG tablet Take 1 tablet by mouth as needed.     . lamoTRIgine (LAMICTAL) 100 MG tablet Take 1 tablet by mouth daily.    . Misc Natural Products (OSTEO BI-FLEX JOINT SHIELD PO) Take 2 tablets by mouth daily.    . pantoprazole  (PROTONIX) 40 MG tablet     . albuterol (PROVENTIL HFA;VENTOLIN HFA) 108 (90 Base) MCG/ACT inhaler Inhale 2 puffs into the lungs every 6 (six) hours as needed for wheezing or shortness of breath. (Patient not taking: Reported on 07/13/2017) 1 Inhaler 6  . budesonide-formoterol (SYMBICORT) 80-4.5 MCG/ACT inhaler Inhale 2 puffs into the lungs 2 (two) times daily. 1 Inhaler 0  . budesonide-formoterol (SYMBICORT) 80-4.5 MCG/ACT inhaler Inhale 2 puffs into the lungs 2 (two) times daily. (Patient not taking: Reported on 07/13/2017) 1 Inhaler 5  . lisinopril (PRINIVIL,ZESTRIL) 5 MG tablet Take by mouth.    . Multiple Vitamin (MULTIVITAMIN) tablet Take 1 tablet by mouth daily.     No current facility-administered medications on file prior to visit.       ROS:  Review of Systems  Constitutional: Negative for fatigue, fever and unexpected weight change.  Respiratory: Negative for cough, shortness of breath and wheezing.   Cardiovascular: Negative for chest pain, palpitations and leg swelling.  Gastrointestinal: Negative for blood in stool, constipation, diarrhea, nausea and vomiting.  Endocrine: Negative for cold intolerance, heat intolerance and polyuria.  Genitourinary: Negative for dyspareunia, dysuria, flank pain, frequency, genital sores, hematuria, menstrual problem, pelvic pain, urgency, vaginal bleeding, vaginal discharge and vaginal pain.  Musculoskeletal: Negative for back pain, joint swelling and myalgias.  Skin: Negative for rash.  Neurological: Positive for headaches. Negative for dizziness, syncope, light-headedness and numbness.  Hematological: Negative for adenopathy.  Psychiatric/Behavioral: Negative for agitation, confusion, sleep disturbance and suicidal ideas. The patient is not nervous/anxious.      Objective: BP 110/68 (BP Location: Left Arm, Patient Position: Sitting, Cuff Size: Normal)   Pulse 64   Ht 5' 6"  (1.676 m)   Wt 157 lb (71.2 kg)   SpO2 99%   BMI 25.34 kg/m      Physical Exam  Constitutional: She is oriented to person, place, and time. She appears well-developed and well-nourished.  Genitourinary: Vagina normal. There is no rash or tenderness on the right labia. There is no rash or tenderness on the left labia. No erythema or tenderness in the vagina. No vaginal discharge found. Right adnexum does not display mass and does not display tenderness. Left adnexum does not display mass and does not display tenderness.  Genitourinary Comments: UTERUS/CX SURG REM  Neck: Normal range of motion. No thyromegaly present.  Cardiovascular: Normal rate, regular rhythm and normal  heart sounds.  No murmur heard. Pulmonary/Chest: Effort normal and breath sounds normal. Right breast exhibits no mass, no nipple discharge, no skin change and no tenderness. Left breast exhibits no mass, no nipple discharge, no skin change and no tenderness.  Abdominal: Soft. There is no tenderness. There is no guarding.  Musculoskeletal: Normal range of motion.  Neurological: She is alert and oriented to person, place, and time. No cranial nerve deficit.  Psychiatric: She has a normal mood and affect. Her behavior is normal.  Vitals reviewed.   Assessment/Plan: Encounter for annual routine gynecological examination  Screening for breast cancer - Pt to sched mammo. - Plan: MM DIGITAL SCREENING BILATERAL  Hormone replacement therapy (HRT) - Rx RF estradiol. F/u prn - Plan: estradiol (ESTRACE) 1 MG tablet  Meds ordered this encounter  Medications  . estradiol (ESTRACE) 1 MG tablet    Sig: Take 1 tablet (1 mg total) by mouth daily.    Dispense:  90 tablet    Refill:  3    Order Specific Question:   Supervising Provider    Answer:   Gae Dry [[id number]]             GYN counsel breast self exam, mammography screening, menopause, adequate intake of calcium and vitamin D, diet and exercise     F/U  Return in about 1 year (around 04/07/2019).  Anshika Pethtel B. Vicky Schleich,  PA-C 04/06/2018 12:25 PM

## 2018-09-03 ENCOUNTER — Ambulatory Visit
Admission: RE | Admit: 2018-09-03 | Discharge: 2018-09-03 | Disposition: A | Discharge status: Home / Self Care | Payer: 59 | Source: Ambulatory Visit | Attending: Obstetrics and Gynecology | Admitting: Obstetrics and Gynecology

## 2018-09-03 ENCOUNTER — Encounter: Payer: Self-pay | Admitting: Obstetrics and Gynecology

## 2018-09-03 ENCOUNTER — Other Ambulatory Visit: Payer: Self-pay | Admitting: Obstetrics and Gynecology

## 2018-09-03 DIAGNOSIS — Z1239 Encounter for other screening for malignant neoplasm of breast: Secondary | ICD-10-CM | POA: Insufficient documentation

## 2018-12-02 ENCOUNTER — Telehealth: Payer: Self-pay

## 2018-12-02 NOTE — Telephone Encounter (Signed)
Pt calling to see if she can have a 61msupply of estradiol called in instead of 90d supply.  [phone]

## 2018-12-02 NOTE — Telephone Encounter (Signed)
Called pt, no answer, [facility]. Please see below.

## 2018-12-02 NOTE — Telephone Encounter (Signed)
Please advise 

## 2018-12-02 NOTE — Telephone Encounter (Signed)
Pt called back, says pharmacist told her already she cannot get 6 month RF.

## 2018-12-02 NOTE — Telephone Encounter (Signed)
Most insurance won't pay for 6 months (only 3 months) at a time. She needs to check with her insurance co.

## 2019-02-25 ENCOUNTER — Other Ambulatory Visit: Payer: Self-pay | Admitting: Obstetrics and Gynecology

## 2019-02-25 DIAGNOSIS — Z79890 Hormone replacement therapy (postmenopausal): Secondary | ICD-10-CM

## 2019-02-25 NOTE — Telephone Encounter (Signed)
Please advise 

## 2019-05-28 ENCOUNTER — Other Ambulatory Visit: Payer: Self-pay | Admitting: Obstetrics and Gynecology

## 2019-05-28 DIAGNOSIS — Z79890 Hormone replacement therapy (postmenopausal): Secondary | ICD-10-CM

## 2019-06-01 NOTE — Progress Notes (Signed)
Chief Complaint  Patient presents with  . Gynecologic Exam  . [employer] Employee     HPI:      [name] is a 43 y.o. [id number] who LMP was No LMP recorded. Patient has had a hysterectomy., presents today for her annual examination. Her menses are absent due to hyst in 2006 for endometriosis. She does not have intermenstrual bleeding/pelvic pain. She takes estradiol 71m daily for ERT. She denies any vasomotor sx if on ERT and wants to continue.  Sex activity: single partner, contraception - status post hysterectomy. Has some vaginal dryness with sex, improved with lubricants.  Last Pap: 01/22/17 Results were: no abnormalities; neg HPV DNA  Hx of STDs: none  Last mammogram: 09/03/18  Results were: normal--routine follow-up in 12 months There is no FH of breast cancer. There is no FH of ovarian cancer. The patient does do self-breast exams.  Tobacco use: The patient denies current or previous tobacco use. Alcohol use: 1-2 glasses wine nightly No drug use.  Exercise: not active  She does get adequate calcium and Vitamin D in her diet.  Labs with PCP   Past Medical History:  Diagnosis Date  . Anxiety   . Breast cyst   . Breast mass in female   . Endometriosis   . Hemorrhoids   . Hypertension   . Menopause 06/25/2011  . Migraine     Past Surgical History:  Procedure Laterality Date  . ABDOMINAL HYSTERECTOMY  2006   TAHBSO due to endometriosis  . BREAST CYST ASPIRATION  2011  . HEMORRHOID BANDING  July 2014  . LAPAROSCOPY  2006  . MANDIBLE FRACTURE SURGERY  1995    Family History  Problem Relation Age of Onset  . Hypertension Mother   . Hypertension Father   . Rheum arthritis Father   . Esophageal cancer Maternal Grandfather 60  . Parkinson's disease Paternal Grandfather   . Breast cancer Neg Hx     Social History   Socioeconomic History  . Marital status: Married    Spouse name: Not on file  . Number of children: 2  . Years of education: 118  . Highest education level: Not on file  Occupational History  . 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 Financial resource strain: Not on file  . Food insecurity    Worry: Not on file    Inability: Not on file  . Transportation needs    Medical: Not on file    Non-medical: Not on file  Tobacco Use  . Smoking status: Former Smoker    Packs/day: 1.00    Years: 20.00    Pack years: 20.00    Types: Cigarettes    Quit date: 2010    Years since quitting: 10.7  . Smokeless tobacco: Never Used  Substance and Sexual Activity  . Alcohol use: Yes    Comment: socially  . Drug use: No  . Sexual activity: Yes    Partners: Male    Birth control/protection: Surgical    Comment: Hysterectomy  Lifestyle  . Physical activity    Days per week: Not on file    Minutes per session: Not on file  . Stress: Not on file  Relationships  . Social [place] phone: Not on file    Gets together: Not on file    Attends religious service: Not on file    Active member of club or organization: Not on  file    Attends meetings of clubs or organizations: Not on file    Relationship status: Not on file  . Intimate partner violence    Fear of current or ex partner: Not on file    Emotionally abused: Not on file    Physically abused: Not on file    Forced sexual activity: Not on file  Other Topics Concern  . Not on file  Social History Narrative   Lives at home w/ her husband and 1 child   Right-handed   Caffeine: cup of coffee each morning    Current Outpatient Medications on File Prior to Visit  Medication Sig Dispense Refill  . Calcium Carbonate (CALCIUM 600 PO) Take 1 tablet by mouth daily.    . Cholecalciferol (VITAMIN D3) 125 MCG (5000 UT) TABS Take by mouth.    . clonazePAM (KLONOPIN) 0.5 MG tablet Take 1 tablet by mouth as needed.     . cyclobenzaprine (FLEXERIL) 5 MG tablet cyclobenzaprine 5 mg tablet    . lamoTRIgine (LAMICTAL) 100 MG tablet Take 1 tablet  by mouth daily.    . meloxicam (MOBIC) 15 MG tablet TAKE 1 TABLET BY MOUTH EVERY DAY    . Misc Natural Products (OSTEO BI-FLEX JOINT SHIELD PO) Take 2 tablets by mouth daily.    . Multiple Vitamin (MULTIVITAMIN) tablet Take 1 tablet by mouth daily.    . pantoprazole (PROTONIX) 40 MG tablet     . triamcinolone cream (KENALOG) 0.5 % Apply topically.    Marland Kitchen lisinopril (PRINIVIL,ZESTRIL) 5 MG tablet Take by mouth.     No current facility-administered medications on file prior to visit.       ROS:  Review of Systems  Constitutional: Negative for fatigue, fever and unexpected weight change.  Respiratory: Negative for cough, shortness of breath and wheezing.   Cardiovascular: Negative for chest pain, palpitations and leg swelling.  Gastrointestinal: Negative for blood in stool, constipation, diarrhea, nausea and vomiting.  Endocrine: Negative for cold intolerance, heat intolerance and polyuria.  Genitourinary: Negative for dyspareunia, dysuria, flank pain, frequency, genital sores, hematuria, menstrual problem, pelvic pain, urgency, vaginal bleeding, vaginal discharge and vaginal pain.  Musculoskeletal: Negative for back pain, joint swelling and myalgias.  Skin: Negative for rash.  Neurological: Negative for dizziness, syncope, light-headedness, numbness and headaches.  Hematological: Negative for adenopathy.  Psychiatric/Behavioral: Negative for agitation, confusion, sleep disturbance and suicidal ideas. The patient is not nervous/anxious.      Objective: BP 110/80   Ht 5' 6"  (1.676 m)   Wt 152 lb (68.9 kg)   BMI 24.53 kg/m    Physical Exam Constitutional:      Appearance: She is well-developed.  Genitourinary:     Vulva and vagina normal.     No vaginal discharge, erythema or tenderness.     Cervix is absent.     Uterus is absent.     No right or left adnexal mass present.     Right adnexa absent.     Right adnexa not tender.     Left adnexa absent.     Left adnexa not  tender.     Genitourinary Comments: UTERUS/CX SURG REM  Neck:     Musculoskeletal: Normal range of motion.     Thyroid: No thyromegaly.  Cardiovascular:     Rate and Rhythm: Normal rate and regular rhythm.     Heart sounds: Normal heart sounds. No murmur.  Pulmonary:     Effort: Pulmonary effort is normal.  Breath sounds: Normal breath sounds.  Chest:     Breasts:        Right: No mass, nipple discharge, skin change or tenderness.        Left: No mass, nipple discharge, skin change or tenderness.  Abdominal:     Palpations: Abdomen is soft.     Tenderness: There is no abdominal tenderness. There is no guarding.  Musculoskeletal: Normal range of motion.  Neurological:     General: No focal deficit present.     Mental Status: She is alert and oriented to person, place, and time.     Cranial Nerves: No cranial nerve deficit.  Skin:    General: Skin is warm and dry.  Psychiatric:        Mood and Affect: Mood normal.        Behavior: Behavior normal.        Thought Content: Thought content normal.        Judgment: Judgment normal.  Vitals signs reviewed.     Assessment/Plan: Encounter for annual routine gynecological examination  Screening for breast cancer - Plan: MM 3D SCREEN BREAST BILATERAL  Hormone replacement therapy (HRT) - Rx RF estradiol. F/u prn - Plan: estradiol (ESTRACE) 1 MG tablet  Meds ordered this encounter  Medications  . estradiol (ESTRACE) 1 MG tablet    Sig: Take 1 tablet (1 mg total) by mouth daily.    Dispense:  90 tablet    Refill:  3    Order Specific Question:   Supervising Provider    Answer:   Gae Dry [[id number]]             GYN counsel breast self exam, mammography screening, menopause, adequate intake of calcium and vitamin D, diet and exercise     F/U  Return in about 1 year (around 06/02/2020).  Alicia B. Copland, PA-C 06/03/2019 8:31 AM

## 2019-06-03 ENCOUNTER — Other Ambulatory Visit: Payer: Self-pay

## 2019-06-03 ENCOUNTER — Ambulatory Visit (INDEPENDENT_AMBULATORY_CARE_PROVIDER_SITE_OTHER): Payer: 59 | Admitting: Obstetrics and Gynecology

## 2019-06-03 ENCOUNTER — Encounter: Payer: Self-pay | Admitting: Obstetrics and Gynecology

## 2019-06-03 VITALS — BP 110/80 | Ht 66.0 in | Wt 152.0 lb

## 2019-06-03 DIAGNOSIS — Z79890 Hormone replacement therapy (postmenopausal): Secondary | ICD-10-CM

## 2019-06-03 DIAGNOSIS — Z01419 Encounter for gynecological examination (general) (routine) without abnormal findings: Secondary | ICD-10-CM | POA: Diagnosis not present

## 2019-06-03 DIAGNOSIS — Z1239 Encounter for other screening for malignant neoplasm of breast: Secondary | ICD-10-CM

## 2019-06-03 MED ORDER — ESTRADIOL 1 MG PO TABS: 1.0000 mg | ORAL_TABLET | Freq: Every day | ORAL | 3 refills | Status: DC

## 2019-06-03 NOTE — Patient Instructions (Signed)
I value your feedback and entrusting [place] with your care. If you get a  patient survey, I would appreciate you taking the time to let us know about your experience today. Thank you!  [place] [facility]: [phone]

## 2019-09-06 ENCOUNTER — Ambulatory Visit
Admission: RE | Admit: 2019-09-06 | Discharge: 2019-09-06 | Disposition: A | Discharge status: Home / Self Care | Payer: 59 | Source: Ambulatory Visit | Attending: Obstetrics and Gynecology | Admitting: Obstetrics and Gynecology

## 2019-09-06 ENCOUNTER — Encounter: Payer: Self-pay | Admitting: Obstetrics and Gynecology

## 2019-09-06 DIAGNOSIS — Z1231 Encounter for screening mammogram for malignant neoplasm of breast: Secondary | ICD-10-CM | POA: Diagnosis present

## 2019-09-06 DIAGNOSIS — Z1239 Encounter for other screening for malignant neoplasm of breast: Secondary | ICD-10-CM

## 2019-11-30 ENCOUNTER — Ambulatory Visit: Payer: Self-pay | Admitting: Cardiology

## 2020-01-06 NOTE — Progress Notes (Signed)
Primary Physician/Referring:  [place] Pink, MD  Patient ID: Denise Hobbs, female    DOB: 1976-02-15, 44 y.o.   MRN: [id number]  Chief Complaint  Patient presents with  . Chest Pain  . Palpitations  . Hypertension   HPI:    Denise Hobbs  is a 44 y.o. Caucasian female who presents for a Follow-up for chest pain. Patient has diagnosis of exercise-induced asthma and has been evaluated by pulmonary medicine in the past.  She has also had left arm tingling and numbness and paresthesia ongoing for several months.  Patient was seen by me 2 years ago and echocardiogram in 2018 was normal without evidence of pulmonary tension and was recommended cardiopulmonary stress test.  She has been doing well and states that occasionally she has had severe chest pain in the middle of the chest sometimes on the left side of the chest and points to her finger in certain areas of the chest.  Mostly occurring at night and nonexertional, and sometimes severe enough that she cannot take a deep breath.  She has also noticed palpitations that occurs at night and especially when she wakes up in the morning as well.  She denies dyspnea.  Past Medical History:  Diagnosis Date  . Acid reflux 01/09/2020  . Anxiety   . Breast cyst   . Breast mass in female   . Endometriosis   . Hemorrhoids   . History of tobacco use 01/09/2020   Quit 2010, 20 pack year history  . Hypertension   . Menopause 06/25/2011  . Migraine    Past Surgical History:  Procedure Laterality Date  . ABDOMINAL HYSTERECTOMY  2006   TAHBSO due to endometriosis  . BREAST CYST ASPIRATION  2011  . HEMORRHOID BANDING  July 2014  . LAPAROSCOPY  2006  . MANDIBLE FRACTURE SURGERY  1995   Family History  Problem Relation Age of Onset  . Hypertension Mother   . Hypertension Father   . Rheum arthritis Father   . Esophageal cancer Maternal Grandfather 60  . Parkinson's disease Paternal Grandfather   . Breast cancer Neg Hx     Social History    Tobacco Use  . Smoking status: Former Smoker    Packs/day: 1.00    Years: 20.00    Pack years: 20.00    Types: Cigarettes    Quit date: 2010    Years since quitting: 11.3  . Smokeless tobacco: Never Used  Substance Use Topics  . Alcohol use: Yes    Comment: socially   Marital Status: Married  ROS  Review of Systems  Cardiovascular: Positive for chest pain and palpitations. Negative for dyspnea on exertion and leg swelling.  Gastrointestinal: Negative for melena.  Neurological: Positive for dizziness.   Objective  Blood pressure 125/86, pulse 75, temperature (!) 97.2 F (36.2 C), temperature source Temporal, resp. rate 18, height 5' 6"  (1.676 m), weight 136 lb (61.7 kg), SpO2 96 %.  Vitals with BMI 01/09/2020 06/03/2019 04/06/2018  Height 5' 6"  5' 6"  5' 6"   Weight 136 lbs 152 lbs 157 lbs  BMI 21.96 32.44 01.02  Systolic 725 366 440  Diastolic 86 80 68  Pulse 75 - 64     Physical Exam  Constitutional:  Petite.  Cardiovascular: Normal rate, regular rhythm, normal heart sounds and intact distal pulses. Exam reveals no gallop.  No murmur heard. No leg edema, no JVD.  Pulmonary/Chest: Effort normal and breath sounds normal.  Abdominal: Soft. Bowel sounds are normal.  Laboratory examination:   No results for input(s): NA, K, CL, CO2, GLUCOSE, BUN, CREATININE, CALCIUM, GFRNONAA, GFRAA in the last 8760 hours. CrCl cannot be calculated (No successful lab value found.).  No flowsheet data found. CBC Latest Ref Rng & Units 06/05/2017  WBC 3.4 - 10.8 x10E3/uL 9.2  Hemoglobin 11.1 - 15.9 g/dL 13.7  Hematocrit 34.0 - 46.6 % 40.0  Platelets 150 - 379 x10E3/uL 238   Lipid Panel  No results found for: CHOL, TRIG, HDL, CHOLHDL, VLDL, LDLCALC, LDLDIRECT HEMOGLOBIN A1C No results found for: HGBA1C, MPG TSH No results for input(s): TSH in the last 8760 hours.  External labs:   07/22/2018: Glucose 87, eGFR 74, Bun/Crea Ratio 18, Na/K 137/4.3, CL 102, CO2 21, Ca 9.7, Protein  Total 7.8, Albumin 4.8, Bilirubin Total 0.6, Alk Phosphatase 47, AST 23, ALT 12. H/H 14/43, MCV 92, Platelets 228.   06/07/2019 Lipid Panel: Cholesterol 191, Triglycerides 60, Hdl C 98, VLDL 11, LDL 82.   Medications and allergies   Allergies  Allergen Reactions  . Amoxicillin-Pot Clavulanate Nausea Only  . Pollen Extract      Current Outpatient Medications  Medication Instructions  . Calcium Carbonate (CALCIUM 600 PO) 1 tablet, Daily  . Cholecalciferol (VITAMIN D3) 125 MCG (5000 UT) TABS Oral  . clonazePAM (KLONOPIN) 0.5 MG tablet 1 tablet, As needed  . diltiazem (CARDIZEM CD) 120 mg, Oral, Every evening  . estradiol (ESTRACE) 1 mg, Oral, Daily  . lamoTRIgine (LAMICTAL) 100 MG tablet 1 tablet, Daily  . meloxicam (MOBIC) 15 MG tablet TAKE 1 TABLET BY MOUTH EVERY DAY  . Misc Natural Products (OSTEO BI-FLEX JOINT SHIELD PO) 2 tablets, Daily  . Multiple Vitamin (MULTIVITAMIN) tablet 1 tablet, Daily  . pantoprazole (PROTONIX) 40 MG tablet No dose, route, or frequency recorded.   Radiology:   No results found.  Cardiac Studies:   Treadmill Stress test 11/11/13: Normal exercise stress test. No significant EKG changes concerning for ischemia.  Good exercise tolerance.  Recommendation: She had no inducible chest pain with exercise.  The chest pain seems to be noncardiac.  Echocardiogram 09/10/2017 :  Left vertical cavity is normal in size. Normal global wall motion. Normal diastolic filling pattern. Calculated EF 69%.  Trace metal regurgitation. Mild tricuspid regurgitation. No evidence of pulmonary hypertension.   EKG  EKG 01/09/2020: Marked sinus bradycardia at the rate of 51 bpm, normal axis, no evidence of ischemia.  Low-voltage complexes.   No significant change from 07/23/2017: Sinus bradycardia at 53 bpm. Otherwise normal EKG.  Assessment     ICD-10-CM   1. Musculoskeletal chest pain  R07.89   2. Essential hypertension  I10 EKG 12-Lead    diltiazem (CARDIZEM CD)  120 MG 24 hr capsule  3. Palpitations  R00.2 diltiazem (CARDIZEM CD) 120 MG 24 hr capsule  4. Right arm and right face tingling  R20.2   5. History of tobacco use  Z87.891      Meds ordered this encounter  Medications  . diltiazem (CARDIZEM CD) 120 MG 24 hr capsule    Sig: Take 1 capsule (120 mg total) by mouth every evening.    Dispense:  30 capsule    Refill:  2    Medications Discontinued During This Encounter  Medication Reason  . cyclobenzaprine (FLEXERIL) 5 MG tablet Discontinued by provider  . triamcinolone cream (KENALOG) 0.5 % Discontinued by provider  . lisinopril (PRINIVIL,ZESTRIL) 5 MG tablet Discontinued by provider    Recommendations:   Denise Hobbs  is a 44  y.o.  Caucasian female who presents for a Follow-up for chest pain. Patient has diagnosis of exercise-induced asthma and has been evaluated by pulmonary medicine in the past.  She has also had left arm tingling and numbness and paresthesia ongoing for several months. Patient was seen by me 2 years ago and echocardiogram in 2018 was normal without evidence of pulmonary tension and was recommended cardiopulmonary stress test.  Her chest pain symptoms are clearly related to musculoskeletal etiology and easily reproducible, has difficulty in taking deep breath due to pain.  She also complains of tingling and numbness in arms and this may also indicate mild cervical neuropathy or disc disease. I do not think she needs stress testing. Her CV risks are prior smoking and mild hypertension.  With regard to hypertension and palpitations, I have discontinued lisinopril and I will switch her to diltiazem CD 120 mg daily.  In the rare event that this is nocturnal angina or Prinzmetal's angina, it will also work as a antianginal agent.  I highly doubt this is Prinzmetal angina.  Although she has bradycardia, she probably will tolerate low-dose of diltiazem.  I offered her to see her back but she wishes to follow-up with her PCP  regarding this.  In case she develops any adverse effects including marked sinus bradycardia with heart rate dipping down to 14E, we could certainly discontinue diltiazem and switch her back to lisinopril at a higher strength at 10 or even 20 mg daily.  I reviewed her labs again with the patient, her lipids are under excellent control and LDL is <HDL and does not with statin therapy.  I simply reassured her.  40-minute office visit. I will personally perform the test and if I find abnormalities,  will perform further evaluation. Otherwise unless new on ongoing symptoms(patient advised to contact us), preventive  therapy is recommended. I will then see the patient on a PRN basis.   Adrian Prows, MD, [facility] 01/09/2020, 3:14 PM [facility] [place] Office: [phone]

## 2020-01-09 ENCOUNTER — Ambulatory Visit: Payer: 59 | Admitting: Cardiology

## 2020-01-09 ENCOUNTER — Encounter: Payer: Self-pay | Admitting: Cardiology

## 2020-01-09 ENCOUNTER — Other Ambulatory Visit: Payer: Self-pay

## 2020-01-09 VITALS — BP 125/86 | HR 75 | Temp 97.2°F | Resp 18 | Ht 66.0 in | Wt 136.0 lb

## 2020-01-09 DIAGNOSIS — R002 Palpitations: Secondary | ICD-10-CM

## 2020-01-09 DIAGNOSIS — I10 Essential (primary) hypertension: Secondary | ICD-10-CM

## 2020-01-09 DIAGNOSIS — K219 Gastro-esophageal reflux disease without esophagitis: Secondary | ICD-10-CM

## 2020-01-09 DIAGNOSIS — R202 Paresthesia of skin: Secondary | ICD-10-CM

## 2020-01-09 DIAGNOSIS — Z87891 Personal history of nicotine dependence: Secondary | ICD-10-CM | POA: Insufficient documentation

## 2020-01-09 DIAGNOSIS — R0789 Other chest pain: Secondary | ICD-10-CM

## 2020-01-09 DIAGNOSIS — Z8249 Family history of ischemic heart disease and other diseases of the circulatory system: Secondary | ICD-10-CM | POA: Insufficient documentation

## 2020-01-09 HISTORY — DX: Personal history of nicotine dependence: Z87.891

## 2020-01-09 HISTORY — DX: Gastro-esophageal reflux disease without esophagitis: K21.9

## 2020-01-09 MED ORDER — DILTIAZEM HCL ER COATED BEADS 120 MG PO CP24: 120.0000 mg | ORAL_CAPSULE | Freq: Every evening | ORAL | 2 refills | Status: DC

## 2020-03-03 ENCOUNTER — Other Ambulatory Visit: Payer: Self-pay | Admitting: Cardiology

## 2020-03-03 DIAGNOSIS — I10 Essential (primary) hypertension: Secondary | ICD-10-CM

## 2020-03-03 DIAGNOSIS — R002 Palpitations: Secondary | ICD-10-CM

## 2020-04-27 ENCOUNTER — Other Ambulatory Visit: Payer: Self-pay | Admitting: Cardiology

## 2020-04-27 ENCOUNTER — Telehealth: Payer: Self-pay

## 2020-04-27 DIAGNOSIS — I10 Essential (primary) hypertension: Secondary | ICD-10-CM

## 2020-04-27 DIAGNOSIS — R002 Palpitations: Secondary | ICD-10-CM

## 2020-04-27 MED ORDER — DILTIAZEM HCL ER COATED BEADS 180 MG PO CP24: 180.0000 mg | ORAL_CAPSULE | Freq: Every day | ORAL | 1 refills | Status: DC

## 2020-04-27 NOTE — Telephone Encounter (Signed)
Patient called and is requesting an increase on her Diltiazem from 1 tablet to 1 a day. She stated that she feels better, but her blood pressure is not where she wants it to be yet. Please advise.

## 2020-06-04 NOTE — Progress Notes (Signed)
Chief Complaint  Patient presents with  . Gynecologic Exam  . [employer] Employee     HPI:      [name] is a 44 y.o. [id number] who LMP was No LMP recorded. Patient has had a hysterectomy., presents today for her annual examination. Her menses are absent due to [facility] in 2006 for endometriosis. She does not have intermenstrual bleeding/pelvic pain. She takes estradiol 50m daily for ERT. She denies any vasomotor sx if on ERT and wants to continue.  Sex activity: single partner, contraception - status post hysterectomy. Has some vaginal dryness with sex, improved with lubricants. Has decreased libido, wonders about tx options. Has headaches with prog in past. Last Pap: 01/22/17 Results were: no abnormalities; neg HPV DNA ; no longer indicated Hx of STDs: none  Last mammogram: 09/06/19  Results were: normal--routine follow-up in 12 months There is no FH of breast cancer. There is no FH of ovarian cancer. The patient does do self-breast exams.  Tobacco use: The patient denies current or previous tobacco use. Alcohol use: 1-2 glasses wine nightly No drug use.  Exercise: min active  Colonoscopy: never  She does get adequate calcium and Vitamin D in her diet.  Labs with PCP   Past Medical History:  Diagnosis Date  . Acid reflux 01/09/2020  . Anxiety   . Breast cyst   . Breast mass in female   . Endometriosis   . Hemorrhoids   . History of tobacco use 01/09/2020   Quit 2010, 20 pack year history  . Hypertension   . Menopause 06/25/2011  . Migraine     Past Surgical History:  Procedure Laterality Date  . BREAST CYST ASPIRATION  2011  . HEMORRHOID BANDING  July 2014  . [place]COPY  2006  [place] T[place] ABDOMINAL HYSTERECTOMY W/ BILATERAL SALPINGOOPHORECTOMY  2006   TAHBSO due to endometriosis    Family History  Problem Relation Age of Onset  . Hypertension Mother   . Hypertension Father   . Rheum arthritis Father   . Esophageal  cancer Maternal Grandfather 60  . Parkinson's disease Paternal Grandfather   . Breast cancer Neg Hx     Social History   Socioeconomic History  . Marital status: Married    Spouse name: Not on file  . Number of children: 2  . Years of education: 144 . Highest education level: Not on file  Occupational History  . 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  Tobacco Use  . Smoking status: Former Smoker    Packs/day: 1.00    Years: 20.00    Pack years: 20.00    Types: Cigarettes    Quit date: 2010    Years since quitting: 11.7  . Smokeless tobacco: Never Used  Vaping Use  . Vaping Use: Never used  Substance and Sexual Activity  . Alcohol use: Yes    Comment: socially  . Drug use: No  . Sexual activity: Yes    Partners: Male    Birth control/protection: Surgical    Comment: Hysterectomy  Other Topics Concern  . Not on file  Social History Narrative   Lives at home w/ her husband and 1 child   Right-handed   Caffeine: cup of coffee each morning   Social Determinants of Health   Financial Resource Strain:   . Difficulty of Paying Living Expenses: Not on file  Food Insecurity:   . Worried About RCharity fundraiser  in the Last Year: Not on file  . Ran Out of Food in the Last Year: Not on file  Transportation Needs:   . Lack of Transportation (Medical): Not on file  . Lack of Transportation (Non-Medical): Not on file  Physical Activity:   . Days of Exercise per Week: Not on file  . Minutes of Exercise per Session: Not on file  Stress:   . Feeling of Stress : Not on file  Social Connections:   . Frequency of Communication with Friends and Family: Not on file  . Frequency of Social Gatherings with Friends and Family: Not on file  . Attends Religious Services: Not on file  . Active Member of Clubs or Organizations: Not on file  . Attends Archivist Meetings: Not on file  . Marital Status: Not on file  Intimate Partner Violence:   . Fear of Current or  Ex-Partner: Not on file  . Emotionally Abused: Not on file  . Physically Abused: Not on file  . Sexually Abused: Not on file    Current Outpatient Medications on File Prior to Visit  Medication Sig Dispense Refill  . Calcium Carbonate (CALCIUM 600 PO) Take 1 tablet by mouth daily.    . Cholecalciferol (VITAMIN D3) 125 MCG (5000 UT) TABS Take by mouth.    . clonazePAM (KLONOPIN) 0.5 MG tablet Take 1 tablet by mouth as needed.     . diltiazem (CARDIZEM CD) 180 MG 24 hr capsule Take 1 capsule (180 mg total) by mouth daily. 90 capsule 1  . lamoTRIgine (LAMICTAL) 100 MG tablet Take 1 tablet by mouth daily.    . meloxicam (MOBIC) 15 MG tablet TAKE 1 TABLET BY MOUTH EVERY DAY    . Misc Natural Products (OSTEO BI-FLEX JOINT SHIELD PO) Take 2 tablets by mouth daily.    . Multiple Vitamin (MULTIVITAMIN) tablet Take 1 tablet by mouth daily.    . pantoprazole (PROTONIX) 40 MG tablet     . SODIUM FLUORIDE 5000 SENSITIVE 1.1-5 % PSTE Take by mouth 4 (four) times daily.     No current facility-administered medications on file prior to visit.      ROS:  Review of Systems  Constitutional: Negative for fatigue, fever and unexpected weight change.  Respiratory: Negative for cough, shortness of breath and wheezing.   Cardiovascular: Negative for chest pain, palpitations and leg swelling.  Gastrointestinal: Negative for blood in stool, constipation, diarrhea, nausea and vomiting.  Endocrine: Negative for cold intolerance, heat intolerance and polyuria.  Genitourinary: Negative for dyspareunia, dysuria, flank pain, frequency, genital sores, hematuria, menstrual problem, pelvic pain, urgency, vaginal bleeding, vaginal discharge and vaginal pain.  Musculoskeletal: Negative for back pain, joint swelling and myalgias.  Skin: Negative for rash.  Neurological: Negative for dizziness, syncope, light-headedness, numbness and headaches.  Hematological: Negative for adenopathy.  Psychiatric/Behavioral: Negative  for agitation, confusion, sleep disturbance and suicidal ideas. The patient is not nervous/anxious.      Objective: BP 100/70   Ht 5' 6"  (1.676 m)   Wt 136 lb (61.7 kg)   BMI 21.95 kg/m    Physical Exam Constitutional:      Appearance: She is well-developed.  Genitourinary:     Vulva and vagina normal.     No vaginal discharge, erythema or tenderness.     Cervix is absent.     Uterus is not tender.     Uterus is absent.     No right or left adnexal mass present.  Right adnexa absent.     Right adnexa not tender.     Left adnexa absent.     Left adnexa not tender.     Genitourinary Comments: UTERUS/CX SURG REM  Neck:     Thyroid: No thyromegaly.  Cardiovascular:     Rate and Rhythm: Normal rate and regular rhythm.     Heart sounds: Normal heart sounds. No murmur heard.   Pulmonary:     Effort: Pulmonary effort is normal.     Breath sounds: Normal breath sounds.  Chest:     Breasts:        Right: No mass, nipple discharge, skin change or tenderness.        Left: No mass, nipple discharge, skin change or tenderness.  Abdominal:     Palpations: Abdomen is soft.     Tenderness: There is no abdominal tenderness. There is no guarding.  Musculoskeletal:        General: Normal range of motion.     Cervical back: Normal range of motion.  Neurological:     General: No focal deficit present.     Mental Status: She is alert and oriented to person, place, and time.     Cranial Nerves: No cranial nerve deficit.  Skin:    General: Skin is warm and dry.  Psychiatric:        Mood and Affect: Mood normal.        Behavior: Behavior normal.        Thought Content: Thought content normal.        Judgment: Judgment normal.  Vitals reviewed.     Assessment/Plan: Encounter for annual routine gynecological examination  Encounter for screening mammogram for malignant neoplasm of breast - Plan: MM 3D SCREEN BREAST BILATERAL; pt to sched mammo  Hormone replacement therapy  (HRT) - Rx RF estradiol. F/u prn - Plan: estradiol (ESTRACE) 1 MG tablet  Decreased libido - Plan: AMBULATORY NON FORMULARY MEDICATION; try testosterone crm. Rx faxed to [facility] for compounding. Add exercise. F/u prn.   Screening for colon cancer - Plan: Cologuard; colonoscopy/cologuard discussed. Pt prefers to do cologuard. Ref sent. Will f/u with results.    Meds ordered this encounter  Medications  . estradiol (ESTRACE) 1 MG tablet    Sig: Take 1 tablet (1 mg total) by mouth daily.    Dispense:  90 tablet    Refill:  3    Order Specific Question:   Supervising Provider    Answer:   Gae Dry [id number]  . AMBULATORY NON FORMULARY MEDICATION    Sig: Medication Name: Testosterone 1% cream (10 mg/ml) Apply pea size amount to inner thigh QD for 2 wks, then 2-3 times weekly as maintenance;    Dispense:  15 mL    Refill:  1    Order Specific Question:   Supervising Provider    Answer:   Gae Dry [[id number]]             GYN counsel breast self exam, mammography screening, menopause, adequate intake of calcium and vitamin D, diet and exercise     F/U  Return in about 1 year (around 06/05/2021).  Denise Hobbs B. Aroura Vasudevan, PA-C 06/05/2020 9:36 AM

## 2020-06-05 ENCOUNTER — Other Ambulatory Visit: Payer: Self-pay

## 2020-06-05 ENCOUNTER — Encounter: Payer: Self-pay | Admitting: Obstetrics and Gynecology

## 2020-06-05 ENCOUNTER — Ambulatory Visit (INDEPENDENT_AMBULATORY_CARE_PROVIDER_SITE_OTHER): Payer: 59 | Admitting: Obstetrics and Gynecology

## 2020-06-05 VITALS — BP 100/70 | Ht 66.0 in | Wt 136.0 lb

## 2020-06-05 DIAGNOSIS — Z79890 Hormone replacement therapy (postmenopausal): Secondary | ICD-10-CM

## 2020-06-05 DIAGNOSIS — Z01419 Encounter for gynecological examination (general) (routine) without abnormal findings: Secondary | ICD-10-CM | POA: Diagnosis not present

## 2020-06-05 DIAGNOSIS — Z1211 Encounter for screening for malignant neoplasm of colon: Secondary | ICD-10-CM

## 2020-06-05 DIAGNOSIS — R6882 Decreased libido: Secondary | ICD-10-CM | POA: Diagnosis not present

## 2020-06-05 DIAGNOSIS — Z1231 Encounter for screening mammogram for malignant neoplasm of breast: Secondary | ICD-10-CM

## 2020-06-05 MED ORDER — ESTRADIOL 1 MG PO TABS: 1.0000 mg | ORAL_TABLET | Freq: Every day | ORAL | 3 refills | Status: DC

## 2020-06-05 MED ORDER — AMBULATORY NON FORMULARY MEDICATION: 1 refills | Status: DC

## 2020-06-05 NOTE — Patient Instructions (Addendum)
I value your feedback and entrusting [place] with your care. If you get a [facility] patient survey, I would appreciate you taking the time to let us know about your experience today. Thank you!  As of August 25, 2019, your lab results will be released to your MyChart immediately, before I even have a chance to see them. Please give me time to review them and contact you if there are any abnormalities. Thank you for your patience.   [place] [facility]: [phone]

## 2020-06-14 ENCOUNTER — Telehealth: Payer: Self-pay | Admitting: Obstetrics and Gynecology

## 2020-06-14 NOTE — Telephone Encounter (Signed)
Patient is calling to follow up on Cologuard request. Please advise

## 2020-06-15 DIAGNOSIS — Z1211 Encounter for screening for malignant neoplasm of colon: Secondary | ICD-10-CM

## 2020-06-15 HISTORY — DX: Encounter for screening for malignant neoplasm of colon: Z12.11

## 2020-06-15 NOTE — Telephone Encounter (Signed)
Pls call pt to get further info. Thx

## 2020-06-18 NOTE — Telephone Encounter (Signed)
Called pt, says she is still waiting on her Cologuard test kit. I called [facility] (cologuard) and was told nothing was shipped because there were 2 possible ins on her file. [employer] and medicare advantage through [employer]? Advised only one we have is [employer]. Kit will be shipped to pt, pt aware of situation, and per her request [facility] # given:concerned abt other ins info, how they got it?

## 2020-07-03 LAB — COLOGUARD: Cologuard: NEGATIVE

## 2020-07-05 ENCOUNTER — Encounter: Payer: Self-pay | Admitting: Obstetrics and Gynecology

## 2020-07-05 NOTE — Telephone Encounter (Signed)
Pt aware of NEG test results.

## 2020-08-17 ENCOUNTER — Other Ambulatory Visit: Payer: Self-pay | Admitting: Otolaryngology

## 2020-08-17 ENCOUNTER — Other Ambulatory Visit (HOSPITAL_COMMUNITY): Payer: Self-pay | Admitting: Otolaryngology

## 2020-08-17 DIAGNOSIS — R42 Dizziness and giddiness: Secondary | ICD-10-CM

## 2020-08-17 DIAGNOSIS — H9202 Otalgia, left ear: Secondary | ICD-10-CM

## 2020-08-29 ENCOUNTER — Ambulatory Visit
Admission: RE | Admit: 2020-08-29 | Discharge: 2020-08-29 | Disposition: A | Discharge status: Home / Self Care | Payer: 59 | Source: Ambulatory Visit | Attending: Otolaryngology | Admitting: Otolaryngology

## 2020-08-29 ENCOUNTER — Other Ambulatory Visit: Payer: Self-pay

## 2020-08-29 DIAGNOSIS — H9202 Otalgia, left ear: Secondary | ICD-10-CM

## 2020-08-29 DIAGNOSIS — R42 Dizziness and giddiness: Secondary | ICD-10-CM | POA: Diagnosis present

## 2020-08-29 MED ORDER — GADOBUTROL 1 MMOL/ML IV SOLN
5.0000 mL | Freq: Once | INTRAVENOUS | Status: AC | PRN
  Administered 2020-08-29: 14:00:00 5 mL via INTRAVENOUS

## 2020-09-11 ENCOUNTER — Ambulatory Visit
Admission: RE | Admit: 2020-09-11 | Discharge: 2020-09-11 | Disposition: A | Discharge status: Home / Self Care | Payer: 59 | Source: Ambulatory Visit | Attending: Obstetrics and Gynecology | Admitting: Obstetrics and Gynecology

## 2020-09-11 ENCOUNTER — Other Ambulatory Visit: Payer: Self-pay

## 2020-09-11 DIAGNOSIS — Z1231 Encounter for screening mammogram for malignant neoplasm of breast: Secondary | ICD-10-CM | POA: Diagnosis present

## 2020-10-25 ENCOUNTER — Other Ambulatory Visit: Payer: Self-pay | Admitting: Cardiology

## 2020-10-25 DIAGNOSIS — I10 Essential (primary) hypertension: Secondary | ICD-10-CM

## 2020-10-25 DIAGNOSIS — R002 Palpitations: Secondary | ICD-10-CM

## 2021-03-25 ENCOUNTER — Telehealth: Payer: 59

## 2021-03-25 NOTE — Telephone Encounter (Signed)
Pt needs appt with anyone available in our office. Has to have eval before imaging ordered.

## 2021-03-25 NOTE — Telephone Encounter (Addendum)
Pt calling; is in a lot of constant pain in left breast; also has knots in that breast; should she be seen or have an extra mammogram done?  What to do?  [phone]  Pt aware [facility] not in office but msg will be sent to her.

## 2021-03-26 NOTE — Telephone Encounter (Signed)
Called and offered tomorrow 03/27/21 with [facility] in [place]. Patient refused requested next available. I offered 11:30 for Monday, 04/01/21 with [facility]. Patient requested next appointment with [facility]. Patient is scheduled for Monday 04/01/21 with [facility] at 4:30

## 2021-03-27 ENCOUNTER — Ambulatory Visit (INDEPENDENT_AMBULATORY_CARE_PROVIDER_SITE_OTHER): Payer: BC Managed Care – PPO | Admitting: Obstetrics and Gynecology

## 2021-03-27 ENCOUNTER — Encounter: Payer: Self-pay | Admitting: Obstetrics and Gynecology

## 2021-03-27 ENCOUNTER — Other Ambulatory Visit: Payer: Self-pay

## 2021-03-27 VITALS — BP 148/84 | Ht 66.0 in | Wt 143.0 lb

## 2021-03-27 DIAGNOSIS — N644 Mastodynia: Secondary | ICD-10-CM

## 2021-03-27 NOTE — Progress Notes (Signed)
Obstetrics & Gynecology Office Visit   Chief Complaint:  Chief Complaint  Patient presents with   Gynecologic Exam    Lt breast lump, bilat breast pain x 7 months. RM 3    History of Present Illness: 45 y.o. [id number] s/p TAH, BSO with right sided breast pain for past 7 month but more constant over the past few weeks.  No trauma or inciting event.  No change in medications. Denies associated skin changes or nipple discharge.  She is on estrogen only HRT.  Last  screening mammogram December 2021 [facility].  No family history of breast or ovarian cancer.      Review of Systems: Review of Systems  Constitutional: Negative.   Genitourinary: Negative.     Past Medical History:  Past Medical History:  Diagnosis Date   Acid reflux 01/09/2020   Anxiety    Breast cyst    Breast mass in female    Endometriosis    Hemorrhoids    History of tobacco use 01/09/2020   Quit 2010, 20 pack year history   Hypertension    Menopause 06/25/2011   Migraine    Screening for colon cancer 06/2020   Cologuard NEG; repeat after 3 yrs    Past Surgical History:  Past Surgical History:  Procedure Laterality Date   BREAST CYST ASPIRATION  2011   HEMORRHOID BANDING  July 2014   LAPAROSCOPY  2006   [place]   TOTAL ABDOMINAL HYSTERECTOMY W/ BILATERAL SALPINGOOPHORECTOMY  2006   TAHBSO due to endometriosis    Gynecologic History: No LMP recorded. Patient has had a hysterectomy.  Obstetric History: [id number]  Family History:  Family History  Problem Relation Age of Onset   Hypertension Mother    Hypertension Father    Rheum arthritis Father    Esophageal cancer Maternal Grandfather 81   Parkinson's disease Paternal Grandfather    Breast cancer Neg Hx     Social History:  Social History   Socioeconomic History   Marital status: Married    Spouse name: Not on file   Number of children: 2   Years of education: 14   Highest education level: Not on file   Occupational History   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  Tobacco Use   Smoking status: Former    Packs/day: 1.00    Years: 20.00    Pack years: 20.00    Types: Cigarettes    Quit date: 2010    Years since quitting: 12.5   Smokeless tobacco: Never  Vaping Use   Vaping Use: Never used  Substance and Sexual Activity   Alcohol use: Yes    Comment: socially   Drug use: No   Sexual activity: Yes    Partners: Male    Birth control/protection: Surgical    Comment: Hysterectomy  Other Topics Concern   Not on file  Social History Narrative   Lives at home w/ her husband and 1 child   Right-handed   Caffeine: cup of coffee each morning   Social Determinants of Health   Financial Resource Strain: Not on file  Food Insecurity: Not on file  Transportation Needs: Not on file  Physical Activity: Not on file  Stress: Not on file  Social Connections: Not on file  Intimate Partner Violence: Not on file    Allergies:  Allergies  Allergen Reactions   Amoxicillin-Pot Clavulanate Nausea Only   Pollen Extract     Medications: Prior  to Admission medications   Medication Sig Start Date End Date Taking? Authorizing Provider  Cholecalciferol (VITAMIN D3) 125 MCG (5000 UT) TABS Take by mouth.   Yes [provider]  clonazePAM (KLONOPIN) 0.5 MG tablet Take 1 tablet by mouth as needed.  05/17/13  Yes [provider]  estradiol (ESTRACE) 1 MG tablet Take 1 tablet (1 mg total) by mouth daily. 0/72/25  Yes Copland, Deirdre Evener, PA-C  lamoTRIgine (LAMICTAL) 100 MG tablet Take 1 tablet by mouth daily. 07/22/13  Yes [provider]  lisinopril (ZESTRIL) 5 MG tablet Take 5 mg by mouth daily. 03/17/21  Yes [provider]  meloxicam (MOBIC) 15 MG tablet TAKE 1 TABLET BY MOUTH EVERY DAY 12/31/18  Yes [provider]  Misc Natural Products (OSTEO BI-FLEX JOINT SHIELD PO) Take 2 tablets by mouth daily.   Yes [provider]  pantoprazole (PROTONIX)  40 MG tablet  11/11/16  Yes [provider]  SODIUM FLUORIDE 5000 SENSITIVE 1.1-5 % PSTE Take by mouth 4 (four) times daily. 04/06/20  Yes [provider]  AMBULATORY NON FORMULARY MEDICATION Medication Name: Testosterone 1% cream (10 mg/ml) Apply pea size amount to inner thigh QD for 2 wks, then 2-3 times weekly as maintenance; Patient not taking: Reported on 03/27/2021 7/50/51   Copland, Deirdre Evener, PA-C  Calcium Carbonate (CALCIUM 600 PO) Take 1 tablet by mouth daily.    [provider]  Multiple Vitamin (MULTIVITAMIN) tablet Take 1 tablet by mouth daily. Patient not taking: Reported on 03/27/2021    [provider]    Physical Exam Vitals:  Vitals:   03/27/21 0901  BP: (!) 148/84   No LMP recorded. Patient has had a hysterectomy.  General: NAD HEENT: normocephalic, anicteric Pulmonary: No increased work of breathing Breast symmetrical, no tenderness in right breast, no palpable nodules or masses, no skin or nipple retraction present, no nipple discharge.  No axillary or supraclavicular lymphadenopathy. She reports tenderness in the right breast, 2 O'Clock approximately 4cm from the nipple line.  No associated skin changes or breast changes appreciated.   Extremities: no edema, erythema, or tenderness Neurologic: Grossly intact Psychiatric: mood appropriate, affect full  Female chaperone present for pelvic  portions of the physical exam  Assessment: 45 y.o. [id number] left breast tenderness  Plan: Problem List Items Addressed This Visit   None Visit Diagnoses     Mastodynia of left breast    -  Primary   Relevant Orders   MM DIAG BREAST TOMO UNI LEFT   US BREAST LTD UNI LEFT INC AXILLA      1) Mastalgia - she reports tenderness in the right breast, 2 O'Clock approximately 4cm from the nipple line.  No associated skin changes or breast changes appreciated.   - loose fitting bras without underwire - warm compress prn - NSAID ibuprofen 876m  every 8hrs prn  2) Return if symptoms worsen or fail to improve.   AMalachy Mood MD, [facility], [facility] 03/27/2021, 9:22 AM

## 2021-04-01 ENCOUNTER — Ambulatory Visit: Payer: 59 | Admitting: Obstetrics and Gynecology

## 2021-04-01 ENCOUNTER — Ambulatory Visit
Admission: RE | Admit: 2021-04-01 | Discharge: 2021-04-01 | Disposition: A | Discharge status: Home / Self Care | Payer: BC Managed Care – PPO | Source: Ambulatory Visit | Attending: Obstetrics and Gynecology | Admitting: Obstetrics and Gynecology

## 2021-04-01 ENCOUNTER — Other Ambulatory Visit: Payer: Self-pay

## 2021-04-01 DIAGNOSIS — N644 Mastodynia: Secondary | ICD-10-CM | POA: Diagnosis not present

## 2021-07-05 ENCOUNTER — Other Ambulatory Visit: Payer: Self-pay | Admitting: Obstetrics and Gynecology

## 2021-07-05 DIAGNOSIS — Z79890 Hormone replacement therapy (postmenopausal): Secondary | ICD-10-CM

## 2021-07-10 NOTE — Progress Notes (Deleted)
No chief complaint on file.    HPI:      [name] is a 45 y.o. [id number] who LMP was No LMP recorded. Patient has had a hysterectomy., presents today for her annual examination. Her menses are absent due to [facility] in 2006 for endometriosis. She does not have PMB/pelvic pain. She takes estradiol 85m daily for ERT. She denies any vasomotor sx if on ERT and wants to continue.   Sex activity: single partner, contraception - status post hysterectomy. Has some vaginal dryness with sex, improved with lubricants. Has decreased libido, wonders about tx options. Has headaches with prog in past. Last Pap: 01/22/17 Results were: no abnormalities; neg HPV DNA ; no longer indicated Hx of STDs: none   Last mammogram: 09/11/20  Results were: normal--routine follow-up in 12 months; has LT breast pain 7/22 with neg LT breast imaging There is no FH of breast cancer. There is no FH of ovarian cancer. The patient does do self-breast exams.   Tobacco use: The patient denies current or previous tobacco use. Alcohol use: 1-2 glasses wine nightly No drug use.  Exercise: min active  Colonoscopy: never; Cologuard NEG 10/21; repeat after 3 yrs   She does get adequate calcium and Vitamin D in her diet.   Labs with PCP    Past Medical History:  Diagnosis Date   Acid reflux 01/09/2020   Anxiety    Breast cyst    Breast mass in female    Endometriosis    Hemorrhoids    History of tobacco use 01/09/2020   Quit 2010, 20 pack year history   Hypertension    Menopause 06/25/2011   Migraine    Screening for colon cancer 06/2020   Cologuard NEG; repeat after 3 yrs    Past Surgical History:  Procedure Laterality Date   HEMORRHOID BANDING  03/2013   [place]OSCOPY  2006   [place]  TOTAL ABDOMINAL HYSTERECTOMY W/ BILATERAL SALPINGOOPHORECTOMY  2006   TAHBSO due to endometriosis    Family History  Problem Relation Age of Onset   Hypertension Mother    Hypertension Father     Rheum arthritis Father    Esophageal cancer Maternal Grandfather 621  Parkinson's disease Paternal Grandfather    Breast cancer Neg Hx     Social History   Socioeconomic History   Marital status: Married    Spouse name: Not on file   Number of children: 2   Years of education: 14   Highest education level: Not on file  Occupational History   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  Tobacco Use   Smoking status: Former    Packs/day: 1.00    Years: 20.00    Pack years: 20.00    Types: Cigarettes    Quit date: 2010    Years since quitting: 12.8   Smokeless tobacco: Never  Vaping Use   Vaping Use: Never used  Substance and Sexual Activity   Alcohol use: Yes    Comment: socially   Drug use: No   Sexual activity: Yes    Partners: Male    Birth control/protection: Surgical    Comment: Hysterectomy  Other Topics Concern   Not on file  Social History Narrative   Lives at home w/ her husband and 1 child   Right-handed   Caffeine: cup of coffee each morning   Social Determinants of Health   Financial Resource Strain: Not on file  Food Insecurity:  Not on file  Transportation Needs: Not on file  Physical Activity: Not on file  Stress: Not on file  Social Connections: Not on file  Intimate Partner Violence: Not on file    Current Outpatient Medications on File Prior to Visit  Medication Sig Dispense Refill   AMBULATORY NON [place] Medication Name: Testosterone 1% cream (10 mg/ml) Apply pea size amount to inner thigh QD for 2 wks, then 2-3 times weekly as maintenance; (Patient not taking: Reported on 03/27/2021) 15 mL 1   Calcium Carbonate (CALCIUM 600 PO) Take 1 tablet by mouth daily.     Cholecalciferol (VITAMIN D3) 125 MCG (5000 UT) TABS Take by mouth.     clonazePAM (KLONOPIN) 0.5 MG tablet Take 1 tablet by mouth as needed.      estradiol (ESTRACE) 1 MG tablet Take 1 tablet (1 mg total) by mouth daily. 90 tablet 3   lamoTRIgine (LAMICTAL) 100 MG  tablet Take 1 tablet by mouth daily.     lisinopril (ZESTRIL) 5 MG tablet Take 5 mg by mouth daily.     meloxicam (MOBIC) 15 MG tablet TAKE 1 TABLET BY MOUTH EVERY DAY     Misc Natural Products (OSTEO BI-FLEX JOINT SHIELD PO) Take 2 tablets by mouth daily.     Multiple Vitamin (MULTIVITAMIN) tablet Take 1 tablet by mouth daily. (Patient not taking: Reported on 03/27/2021)     pantoprazole (PROTONIX) 40 MG tablet      SODIUM FLUORIDE 5000 SENSITIVE 1.1-5 % PSTE Take by mouth 4 (four) times daily.     No current facility-administered medications on file prior to visit.      ROS:  Review of Systems  Constitutional:  Negative for fatigue, fever and unexpected weight change.  Respiratory:  Negative for cough, shortness of breath and wheezing.   Cardiovascular:  Negative for chest pain, palpitations and leg swelling.  Gastrointestinal:  Negative for blood in stool, constipation, diarrhea, nausea and vomiting.  Endocrine: Negative for cold intolerance, heat intolerance and polyuria.  Genitourinary:  Negative for dyspareunia, dysuria, flank pain, frequency, genital sores, hematuria, menstrual problem, pelvic pain, urgency, vaginal bleeding, vaginal discharge and vaginal pain.  Musculoskeletal:  Negative for back pain, joint swelling and myalgias.  Skin:  Negative for rash.  Neurological:  Negative for dizziness, syncope, light-headedness, numbness and headaches.  Hematological:  Negative for adenopathy.  Psychiatric/Behavioral:  Negative for agitation, confusion, sleep disturbance and suicidal ideas. The patient is not nervous/anxious.     Objective: There were no vitals taken for this visit.   Physical Exam Constitutional:      Appearance: She is well-developed.  Genitourinary:     Vulva normal.     Genitourinary Comments: UTERUS/CX SURG REM     No vaginal discharge, erythema or tenderness.      Right Adnexa: absent.    Right Adnexa: not tender and no mass present.    Left Adnexa:  absent.    Left Adnexa: not tender and no mass present.    Cervix is absent.     Uterus is not tender.     Uterus is absent.  Breasts:    Right: No mass, nipple discharge, skin change or tenderness.     Left: No mass, nipple discharge, skin change or tenderness.  Neck:     Thyroid: No thyromegaly.  Cardiovascular:     Rate and Rhythm: Normal rate and regular rhythm.     Heart sounds: Normal heart sounds. No murmur heard. Pulmonary:  Effort: Pulmonary effort is normal.     Breath sounds: Normal breath sounds.  Abdominal:     Palpations: Abdomen is soft.     Tenderness: There is no abdominal tenderness. There is no guarding.  Musculoskeletal:        General: Normal range of motion.     Cervical back: Normal range of motion.  Neurological:     General: No focal deficit present.     Mental Status: She is alert and oriented to person, place, and time.     Cranial Nerves: No cranial nerve deficit.  Skin:    General: Skin is warm and dry.  Psychiatric:        Mood and Affect: Mood normal.        Behavior: Behavior normal.        Thought Content: Thought content normal.        Judgment: Judgment normal.  Vitals reviewed.    Assessment/Plan: Encounter for annual routine gynecological examination  Encounter for screening mammogram for malignant neoplasm of breast - Plan: MM 3D SCREEN BREAST BILATERAL; pt to sched mammo  Hormone replacement therapy (HRT) - Rx RF estradiol. F/u prn - Plan: estradiol (ESTRACE) 1 MG tablet  Decreased libido - Plan: AMBULATORY NON FORMULARY MEDICATION; try testosterone crm. Rx faxed to [facility] for compounding. Add exercise. F/u prn.   Screening for colon cancer - Plan: Cologuard; colonoscopy/cologuard discussed. Pt prefers to do cologuard. Ref sent. Will f/u with results.    No orders of the defined types were placed in this encounter.            GYN counsel breast self exam, mammography screening, menopause, adequate intake of calcium and  vitamin D, diet and exercise     F/U  No follow-ups on file.  Brithany Whitworth B. Ardis Fullwood, PA-C 07/10/2021 2:50 PM

## 2021-07-11 ENCOUNTER — Ambulatory Visit: Payer: BC Managed Care – PPO | Admitting: Obstetrics and Gynecology

## 2021-07-11 ENCOUNTER — Other Ambulatory Visit: Payer: Self-pay

## 2021-07-11 DIAGNOSIS — Z79890 Hormone replacement therapy (postmenopausal): Secondary | ICD-10-CM

## 2021-07-11 MED ORDER — ESTRADIOL 1 MG PO TABS: 1.0000 mg | ORAL_TABLET | Freq: Every day | ORAL | 0 refills | Status: DC

## 2021-07-11 NOTE — Telephone Encounter (Signed)
Pt calling; has resch appt to 07/29/21; needs refill of estradiol; will run out this week.  [phone]  Pt aware refill eRx'd.

## 2021-07-28 NOTE — Progress Notes (Signed)
Chief Complaint  Patient presents with   Gynecologic Exam    No concerns      HPI:      [name] is a 45 y.o. [id number] who LMP was No LMP recorded. Patient has had a hysterectomy., presents today for her annual examination. Her menses are absent due to [facility] in 2006 for endometriosis. She does not have PMB/pelvic pain. She takes estradiol 71m daily for ERT. She denies any vasomotor sx if on ERT and wants to continue.  Has been having RLQ and stomach pains after eating certain foods, particularly fried foods.    Sex activity: single partner, contraception - status post hysterectomy. Has some vaginal dryness with sex, improved with lubricants/coconut oil. Has decreased libido, tried testosterone crm last yr without relief and had dizziness. Had headaches with prog in past. Last Pap: 01/22/17 Results were: no abnormalities; neg HPV DNA ; no longer indicated Hx of STDs: none   Last mammogram: 09/11/20  Results were: normal--routine follow-up in 12 months; had LT breast pain 7/22 with neg LT breast imaging; bilat scr mammo due 12/22. There is no FH of breast cancer. There is no FH of ovarian cancer. The patient does do self-breast exams.   Tobacco use: The patient denies current or previous tobacco use. Alcohol use: 1-2 glasses wine nightly No drug use.  Exercise: min active  Colonoscopy: never; Cologuard NEG 10/21; repeat after 3 yrs   She does get adequate calcium and Vitamin D in her diet.   Labs with PCP    Past Medical History:  Diagnosis Date   Acid reflux 01/09/2020   Anxiety    Breast cyst    Breast mass in female    Endometriosis    Hemorrhoids    History of tobacco use 01/09/2020   Quit 2010, 20 pack year history   Hypertension    Migraine    Premature menopause 06/25/2011   S/p TAHBSO   Screening for colon cancer 06/2020   Cologuard NEG; repeat after 3 yrs    Past Surgical History:  Procedure Laterality Date   HEMORRHOID BANDING  03/2013    LAPAROSCOPY  2006   [place]  TOTAL ABDOMINAL HYSTERECTOMY W/ BILATERAL SALPINGOOPHORECTOMY  2006   TAHBSO due to endometriosis    Family History  Problem Relation Age of Onset   Hypertension Mother    Hypertension Father    Rheum arthritis Father    Esophageal cancer Maternal Grandfather 673  Parkinson's disease Paternal Grandfather    Breast cancer Neg Hx     Social History   Socioeconomic History   Marital status: Married    Spouse name: Not on file   Number of children: 2   Years of education: 14   Highest education level: Not on file  Occupational History   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  Tobacco Use   Smoking status: Former    Packs/day: 1.00    Years: 20.00    Pack years: 20.00    Types: Cigarettes    Quit date: 2010    Years since quitting: 12.8   Smokeless tobacco: Never  Vaping Use   Vaping Use: Never used  Substance and Sexual Activity   Alcohol use: Yes    Comment: socially   Drug use: No   Sexual activity: Yes    Partners: Male    Birth control/protection: Surgical    Comment: Hysterectomy  Other Topics Concern   Not  on file  Social History Narrative   Lives at home w/ her husband and 1 child   Right-handed   Caffeine: cup of coffee each morning   Social Determinants of Health   Financial Resource Strain: Not on file  Food Insecurity: Not on file  Transportation Needs: Not on file  Physical Activity: Not on file  Stress: Not on file  Social Connections: Not on file  Intimate Partner Violence: Not on file    Current Outpatient Medications on File Prior to Visit  Medication Sig Dispense Refill   Calcium Carbonate (CALCIUM 600 PO) Take 1 tablet by mouth daily.     Cholecalciferol (VITAMIN D3) 125 MCG (5000 UT) TABS Take by mouth.     clonazePAM (KLONOPIN) 0.5 MG tablet Take 1 tablet by mouth as needed.      lamoTRIgine (LAMICTAL) 100 MG tablet Take 1 tablet by mouth daily.     lisinopril (ZESTRIL) 5 MG  tablet Take 5 mg by mouth daily.     meloxicam (MOBIC) 15 MG tablet TAKE 1 TABLET BY MOUTH EVERY DAY     Misc Natural Products (OSTEO BI-FLEX JOINT SHIELD PO) Take 2 tablets by mouth daily.     Multiple Vitamin (MULTIVITAMIN) tablet Take 1 tablet by mouth daily.     pantoprazole (PROTONIX) 40 MG tablet      SODIUM FLUORIDE 5000 SENSITIVE 1.1-5 % PSTE Take by mouth 4 (four) times daily.     AMBULATORY NON FORMULARY MEDICATION Medication Name: Testosterone 1% cream (10 mg/ml) Apply pea size amount to inner thigh QD for 2 wks, then 2-3 times weekly as maintenance; (Patient not taking: No sig reported) 15 mL 1   No current facility-administered medications on file prior to visit.      ROS:  Review of Systems  Constitutional:  Negative for fatigue, fever and unexpected weight change.  Respiratory:  Negative for cough, shortness of breath and wheezing.   Cardiovascular:  Negative for chest pain, palpitations and leg swelling.  Gastrointestinal:  Negative for blood in stool, constipation, diarrhea, nausea and vomiting.  Endocrine: Negative for cold intolerance, heat intolerance and polyuria.  Genitourinary:  Positive for pelvic pain. Negative for dyspareunia, dysuria, flank pain, frequency, genital sores, hematuria, menstrual problem, urgency, vaginal bleeding, vaginal discharge and vaginal pain.  Musculoskeletal:  Positive for arthralgias. Negative for back pain, joint swelling and myalgias.  Skin:  Negative for rash.  Neurological:  Positive for headaches. Negative for dizziness, syncope, light-headedness and numbness.  Hematological:  Negative for adenopathy.  Psychiatric/Behavioral:  Positive for agitation. Negative for confusion, sleep disturbance and suicidal ideas. The patient is not nervous/anxious.     Objective: BP 120/80   Ht 5' 6"  (1.676 m)   Wt 148 lb (67.1 kg)   BMI 23.89 kg/m    Physical Exam Constitutional:      Appearance: She is well-developed.  Genitourinary:      Vulva normal.     Genitourinary Comments: UTERUS/CX SURG REM     Right Labia: No rash, tenderness or lesions.    Left Labia: No tenderness, lesions or rash.    Vaginal cuff intact.    No vaginal discharge, erythema or tenderness.      Right Adnexa: absent.    Right Adnexa: not tender and no mass present.    Left Adnexa: absent.    Left Adnexa: not tender and no mass present.    Cervix is absent.     Uterus is not tender.     Uterus  is absent.  Breasts:    Right: No mass, nipple discharge, skin change or tenderness.     Left: No mass, nipple discharge, skin change or tenderness.  Neck:     Thyroid: No thyromegaly.  Cardiovascular:     Rate and Rhythm: Normal rate and regular rhythm.     Heart sounds: Normal heart sounds. No murmur heard. Pulmonary:     Effort: Pulmonary effort is normal.     Breath sounds: Normal breath sounds.  Abdominal:     Palpations: Abdomen is soft.     Tenderness: There is no abdominal tenderness. There is no guarding.  Musculoskeletal:        General: Normal range of motion.     Cervical back: Normal range of motion.  Neurological:     General: No focal deficit present.     Mental Status: She is alert and oriented to person, place, and time.     Cranial Nerves: No cranial nerve deficit.  Skin:    General: Skin is warm and dry.  Psychiatric:        Mood and Affect: Mood normal.        Behavior: Behavior normal.        Thought Content: Thought content normal.        Judgment: Judgment normal.  Vitals reviewed.    Assessment/Plan: Encounter for annual routine gynecological examination  Encounter for screening mammogram for malignant neoplasm of breast - Plan: MM 3D SCREEN BREAST BILATERAL; pt to sched mammo  Hormone replacement therapy (HRT) - Plan: estradiol (ESTRACE) 1 MG tablet; Rx RF. Doing well.  Premature menopause on HRT - Plan: estradiol (ESTRACE) 1 MG tablet  Mastodynia of left breast--neg mammo/u/s 7/22, has f/u 12/22. D/C caffeine,  add Vit E 400 IUD BID  Decreased libido--no improvement with testosterone crm. F/u prn.    Meds ordered this encounter  Medications   estradiol (ESTRACE) 1 MG tablet    Sig: Take 1 tablet (1 mg total) by mouth daily.    Dispense:  90 tablet    Refill:  3    Order Specific Question:   Supervising Provider    Answer:   Gae Dry [[id number]]              GYN counsel breast self exam, mammography screening, menopause, adequate intake of calcium and vitamin D, diet and exercise     F/U  Return in about 1 year (around 07/29/2022).  Jenet Durio B. Justeen Hehr, PA-C 07/29/2021 11:30 AM

## 2021-07-29 ENCOUNTER — Encounter: Payer: Self-pay | Admitting: Obstetrics and Gynecology

## 2021-07-29 ENCOUNTER — Other Ambulatory Visit: Payer: Self-pay

## 2021-07-29 ENCOUNTER — Ambulatory Visit (INDEPENDENT_AMBULATORY_CARE_PROVIDER_SITE_OTHER): Payer: BC Managed Care – PPO | Admitting: Obstetrics and Gynecology

## 2021-07-29 VITALS — BP 120/80 | Ht 66.0 in | Wt 148.0 lb

## 2021-07-29 DIAGNOSIS — Z01419 Encounter for gynecological examination (general) (routine) without abnormal findings: Secondary | ICD-10-CM

## 2021-07-29 DIAGNOSIS — Z79890 Hormone replacement therapy (postmenopausal): Secondary | ICD-10-CM | POA: Diagnosis not present

## 2021-07-29 DIAGNOSIS — E28319 Asymptomatic premature menopause: Secondary | ICD-10-CM | POA: Diagnosis not present

## 2021-07-29 DIAGNOSIS — Z1231 Encounter for screening mammogram for malignant neoplasm of breast: Secondary | ICD-10-CM | POA: Diagnosis not present

## 2021-07-29 DIAGNOSIS — Z1211 Encounter for screening for malignant neoplasm of colon: Secondary | ICD-10-CM

## 2021-07-29 DIAGNOSIS — R6882 Decreased libido: Secondary | ICD-10-CM

## 2021-07-29 DIAGNOSIS — N644 Mastodynia: Secondary | ICD-10-CM

## 2021-07-29 MED ORDER — ESTRADIOL 1 MG PO TABS: 1.0000 mg | ORAL_TABLET | Freq: Every day | ORAL | 3 refills | Status: DC

## 2021-07-29 NOTE — Patient Instructions (Signed)
I value your feedback and you entrusting [place] with your care. If you get a [facility] patient survey, I would appreciate you taking the time to let us know about your experience today. Thank you!  [place] at [facility]: [phone]

## 2021-09-12 ENCOUNTER — Inpatient Hospital Stay: Admission: RE | Admit: 2021-09-12 | Payer: BC Managed Care – PPO | Source: Ambulatory Visit

## 2021-10-31 ENCOUNTER — Other Ambulatory Visit: Payer: Self-pay

## 2021-10-31 ENCOUNTER — Ambulatory Visit
Admission: RE | Admit: 2021-10-31 | Discharge: 2021-10-31 | Disposition: A | Discharge status: Home / Self Care | Payer: BC Managed Care – PPO | Source: Ambulatory Visit | Attending: Obstetrics and Gynecology | Admitting: Obstetrics and Gynecology

## 2021-10-31 DIAGNOSIS — Z1231 Encounter for screening mammogram for malignant neoplasm of breast: Secondary | ICD-10-CM | POA: Insufficient documentation

## 2022-01-06 ENCOUNTER — Encounter: Payer: Self-pay | Admitting: Obstetrics and Gynecology

## 2022-01-06 ENCOUNTER — Other Ambulatory Visit: Payer: Self-pay | Admitting: Obstetrics and Gynecology

## 2022-01-06 MED ORDER — HYDROCORT-PRAMOXINE (PERIANAL) 2.5-1 % EX CREA: TOPICAL_CREAM | Freq: Two times a day (BID) | CUTANEOUS | 0 refills | Status: AC | PRN

## 2022-01-06 NOTE — Progress Notes (Signed)
Rx analpram for hemorrhoids.  ?

## 2022-05-21 ENCOUNTER — Telehealth: Payer: Self-pay

## 2022-05-21 NOTE — Telephone Encounter (Signed)
Ecnounter has been routed to provider and CMA's to address

## 2022-05-27 ENCOUNTER — Ambulatory Visit: Payer: BC Managed Care – PPO | Admitting: Podiatry

## 2022-05-27 ENCOUNTER — Telehealth: Payer: Self-pay

## 2022-05-27 ENCOUNTER — Ambulatory Visit (INDEPENDENT_AMBULATORY_CARE_PROVIDER_SITE_OTHER): Payer: BC Managed Care – PPO

## 2022-05-27 DIAGNOSIS — M722 Plantar fascial fibromatosis: Secondary | ICD-10-CM

## 2022-05-27 MED ORDER — MELOXICAM 15 MG PO TABS: 15.0000 mg | ORAL_TABLET | Freq: Every day | ORAL | 1 refills | Status: AC

## 2022-05-27 MED ORDER — METHYLPREDNISOLONE 4 MG PO TBPK: ORAL_TABLET | ORAL | 0 refills | Status: DC

## 2022-05-27 NOTE — Addendum Note (Signed)
Addended by: Felecia Shelling on: 05/27/2022 05:04 PM   Modules accepted: Orders

## 2022-05-27 NOTE — Telephone Encounter (Signed)
Prescription sent.  Thanks, Dr. Laykin Rainone

## 2022-05-27 NOTE — Progress Notes (Signed)
   Chief Complaint  Patient presents with   Foot Pain    Patient is here for right foot pain, patient states that the pain started last Monday.    HPI: 46 y.o. female presenting today as a new patient for evaluation of pain and tenderness to the right lower extremity.  Patient states that on Monday, 05/19/2022 she was walking down the stairs and she noticed pain and tenderness to the right lower extremity.  Initially she was unable to bear weight but is now walking in tennis shoes.  There has been some slight improvement over the past week.  She presents for further treatment and evaluation  Past Medical History:  Diagnosis Date   Acid reflux 01/09/2020   Anxiety    Breast cyst    Breast mass in female    Endometriosis    Hemorrhoids    History of tobacco use 01/09/2020   Quit 2010, 20 pack year history   Hypertension    Migraine    Premature menopause 06/25/2011   S/p TAHBSO   Screening for colon cancer 06/2020   Cologuard NEG; repeat after 3 yrs    Past Surgical History:  Procedure Laterality Date   HEMORRHOID BANDING  03/2013   LAPAROSCOPY  2006   MANDIBLE FRACTURE SURGERY  1995   TOTAL ABDOMINAL HYSTERECTOMY W/ BILATERAL SALPINGOOPHORECTOMY  2006   TAHBSO due to endometriosis    Allergies  Allergen Reactions   Amoxicillin-Pot Clavulanate Nausea Only   Pollen Extract      Physical Exam: General: The patient is alert and oriented x3 in no acute distress.  Dermatology: Skin is warm, dry and supple bilateral lower extremities. Negative for open lesions or macerations.  Vascular: Palpable pedal pulses bilaterally. Capillary refill within normal limits.  Negative for any significant edema or erythema  Neurological: Light touch and protective threshold grossly intact  Musculoskeletal Exam: No pedal deformities noted.  Diffuse pain throughout palpation of the lateral aspect of the right foot, posterior heel, plantar heel, and at the Achilles tendon right lower extremity.   Tenderness with palpation to the anterior aspect of the ankle joint as well  Radiographic Exam:  Normal osseous mineralization. Joint spaces preserved. No fracture/dislocation/boney destruction.    Assessment: 1.  Diffuse generalized foot and ankle pain right lower extremity   Plan of Care:  1. Patient evaluated. X-Rays reviewed.  2.  Prescription for Medrol Dosepak 3.  Prescription for meloxicam 15 mg daily 4.  Cam boot dispensed.  Weightbearing as tolerated x3 weeks 5.  Return to clinic in 3 weeks      Felecia Shelling, DPM [facility]  Dr. Felecia Shelling, DPM    [address] [place], [place] [address]                Office [phone]  Fax [phone]

## 2022-06-17 ENCOUNTER — Ambulatory Visit: Payer: BC Managed Care – PPO | Admitting: Podiatry

## 2022-09-20 ENCOUNTER — Other Ambulatory Visit: Payer: Self-pay | Admitting: Obstetrics and Gynecology

## 2022-09-20 DIAGNOSIS — E28319 Asymptomatic premature menopause: Secondary | ICD-10-CM

## 2022-09-20 DIAGNOSIS — Z79890 Hormone replacement therapy (postmenopausal): Secondary | ICD-10-CM

## 2022-09-30 ENCOUNTER — Other Ambulatory Visit: Payer: Self-pay

## 2022-09-30 DIAGNOSIS — E28319 Asymptomatic premature menopause: Secondary | ICD-10-CM

## 2022-09-30 DIAGNOSIS — Z79890 Hormone replacement therapy (postmenopausal): Secondary | ICD-10-CM

## 2022-09-30 MED ORDER — ESTRADIOL 1 MG PO TABS: 1.0000 mg | ORAL_TABLET | Freq: Every day | ORAL | 0 refills | Status: DC

## 2022-09-30 NOTE — Telephone Encounter (Signed)
Pt calling for refill of estradiol; is out; has scheduled annual.  Adv will sent in refill.

## 2022-10-02 ENCOUNTER — Other Ambulatory Visit: Payer: Self-pay | Admitting: Obstetrics and Gynecology

## 2022-10-02 DIAGNOSIS — Z1231 Encounter for screening mammogram for malignant neoplasm of breast: Secondary | ICD-10-CM

## 2022-11-03 NOTE — Progress Notes (Unsigned)
No chief complaint on file.     HPI:      [name] is a 47 y.o. [id number] who LMP was No LMP recorded. Patient has had a hysterectomy., presents today for her annual examination. Her menses are absent due to [facility] in 2006 for endometriosis. She does not have PMB/pelvic pain. She takes estradiol 67m daily for ERT. She denies any vasomotor sx if on ERT and wants to continue.  Has been having RLQ and stomach pains after eating certain foods, particularly fried foods.    Sex activity: single partner, contraception - status post hysterectomy. Has some vaginal dryness with sex, improved with lubricants/coconut oil. Has decreased libido, tried testosterone crm last yr without relief and had dizziness. Had headaches with prog in past. Last Pap: 01/22/17 Results were: no abnormalities; neg HPV DNA Hx of STDs: none   Last mammogram: 10/31/21  Results were: normal--routine follow-up in 12 months There is no FH of breast cancer. There is no FH of ovarian cancer. The patient does do self-breast exams.   Tobacco use: The patient denies current or previous tobacco use. Alcohol use: 1-2 glasses wine nightly No drug use.  Exercise: min active  Colonoscopy: never; Cologuard NEG 10/21; repeat after 3 yrs   She does get adequate calcium and Vitamin D in her diet.   Labs with PCP    Past Medical History:  Diagnosis Date   Acid reflux 01/09/2020   Anxiety    Breast cyst    Breast mass in female    Endometriosis    Hemorrhoids    History of tobacco use 01/09/2020   Quit 2010, 20 pack year history   Hypertension    Migraine    Premature menopause 06/25/2011   S/p TAHBSO   Screening for colon cancer 06/2020   Cologuard NEG; repeat after 3 yrs    Past Surgical History:  Procedure Laterality Date   HEMORRHOID BANDING  03/2013   [place]  TOTAL ABDOMINAL HYSTERECTOMY W/ BILATERAL SALPINGOOPHORECTOMY  2006   TAHBSO due to endometriosis     Family History  Problem Relation Age of Onset   Hypertension Mother    Hypertension Father    Rheum arthritis Father    Esophageal cancer Maternal Grandfather 636  Parkinson's disease Paternal Grandfather    Breast cancer Neg Hx     Social History   Socioeconomic History   Marital status: Married    Spouse name: Not on file   Number of children: 2   Years of education: 14   Highest education level: Not on file  Occupational History   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  Tobacco Use   Smoking status: Former    Packs/day: 1.00    Years: 20.00    Total pack years: 20.00    Types: Cigarettes    Quit date: 2010    Years since quitting: 14.1   Smokeless tobacco: Never  Vaping Use   Vaping Use: Never used  Substance and Sexual Activity   Alcohol use: Yes    Comment: socially   Drug use: No   Sexual activity: Yes    Partners: Male    Birth control/protection: Surgical    Comment: Hysterectomy  Other Topics Concern   Not on file  Social History Narrative   Lives at home w/ her husband and 1 child   Right-handed   Caffeine: cup of coffee each morning  Social Determinants of Health   Financial Resource Strain: Not on file  Food Insecurity: Not on file  Transportation Needs: Not on file  Physical Activity: Not on file  Stress: Not on file  Social Connections: Not on file  Intimate Partner Violence: Not on file    Current Outpatient Medications on File Prior to Visit  Medication Sig Dispense Refill   AMBULATORY NON [place] Medication Name: Testosterone 1% cream (10 mg/ml) Apply pea size amount to inner thigh QD for 2 wks, then 2-3 times weekly as maintenance; 15 mL 1   Calcium Carbonate (CALCIUM 600 PO) Take 1 tablet by mouth daily.     Cholecalciferol (VITAMIN D3) 125 MCG (5000 UT) TABS Take by mouth.     clonazePAM (KLONOPIN) 0.5 MG tablet Take 1 tablet by mouth as needed.      estradiol (ESTRACE) 1 MG tablet Take 1 tablet (1 mg total) by  mouth daily. 90 tablet 0   lamoTRIgine (LAMICTAL) 100 MG tablet Take 1 tablet by mouth daily.     lisinopril (ZESTRIL) 5 MG tablet Take 5 mg by mouth daily.     meloxicam (MOBIC) 15 MG tablet TAKE 1 TABLET BY MOUTH EVERY DAY     meloxicam (MOBIC) 15 MG tablet Take 1 tablet (15 mg total) by mouth daily. 30 tablet 1   methylPREDNISolone (MEDROL DOSEPAK) 4 MG TBPK tablet 6 day dose pack - take as directed 21 tablet 0   Misc Natural Products (OSTEO BI-FLEX JOINT SHIELD PO) Take 2 tablets by mouth daily.     Multiple Vitamin (MULTIVITAMIN) tablet Take 1 tablet by mouth daily.     pantoprazole (PROTONIX) 40 MG tablet      SODIUM FLUORIDE 5000 SENSITIVE 1.1-5 % PSTE Take by mouth 4 (four) times daily.     No current facility-administered medications on file prior to visit.      ROS:  Review of Systems  Constitutional:  Negative for fatigue, fever and unexpected weight change.  Respiratory:  Negative for cough, shortness of breath and wheezing.   Cardiovascular:  Negative for chest pain, palpitations and leg swelling.  Gastrointestinal:  Negative for blood in stool, constipation, diarrhea, nausea and vomiting.  Endocrine: Negative for cold intolerance, heat intolerance and polyuria.  Genitourinary:  Positive for pelvic pain. Negative for dyspareunia, dysuria, flank pain, frequency, genital sores, hematuria, menstrual problem, urgency, vaginal bleeding, vaginal discharge and vaginal pain.  Musculoskeletal:  Positive for arthralgias. Negative for back pain, joint swelling and myalgias.  Skin:  Negative for rash.  Neurological:  Positive for headaches. Negative for dizziness, syncope, light-headedness and numbness.  Hematological:  Negative for adenopathy.  Psychiatric/Behavioral:  Positive for agitation. Negative for confusion, sleep disturbance and suicidal ideas. The patient is not nervous/anxious.      Objective: There were no vitals taken for this visit.   Physical  Exam Constitutional:      Appearance: She is well-developed.  Genitourinary:     Vulva normal.     Genitourinary Comments: UTERUS/CX SURG REM     Right Labia: No rash, tenderness or lesions.    Left Labia: No tenderness, lesions or rash.    Vaginal cuff intact.    No vaginal discharge, erythema or tenderness.      Right Adnexa: absent.    Right Adnexa: not tender and no mass present.    Left Adnexa: absent.    Left Adnexa: not tender and no mass present.    Cervix is absent.  Uterus is not tender.     Uterus is absent.  Breasts:    Right: No mass, nipple discharge, skin change or tenderness.     Left: No mass, nipple discharge, skin change or tenderness.  Neck:     Thyroid: No thyromegaly.  Cardiovascular:     Rate and Rhythm: Normal rate and regular rhythm.     Heart sounds: Normal heart sounds. No murmur heard. Pulmonary:     Effort: Pulmonary effort is normal.     Breath sounds: Normal breath sounds.  Abdominal:     Palpations: Abdomen is soft.     Tenderness: There is no abdominal tenderness. There is no guarding.  Musculoskeletal:        General: Normal range of motion.     Cervical back: Normal range of motion.  Neurological:     General: No focal deficit present.     Mental Status: She is alert and oriented to person, place, and time.     Cranial Nerves: No cranial nerve deficit.  Skin:    General: Skin is warm and dry.  Psychiatric:        Mood and Affect: Mood normal.        Behavior: Behavior normal.        Thought Content: Thought content normal.        Judgment: Judgment normal.  Vitals reviewed.     Assessment/Plan: Encounter for annual routine gynecological examination  Encounter for screening mammogram for malignant neoplasm of breast - Plan: MM 3D SCREEN BREAST BILATERAL; pt to sched mammo  Hormone replacement therapy (HRT) - Plan: estradiol (ESTRACE) 1 MG tablet; Rx RF. Doing well.  Premature menopause on HRT - Plan: estradiol (ESTRACE) 1  MG tablet  Mastodynia of left breast--neg mammo/u/s 7/22, has f/u 12/22. D/C caffeine, add Vit E 400 IUD BID  Decreased libido--no improvement with testosterone crm. F/u prn.    No orders of the defined types were placed in this encounter.             GYN counsel breast self exam, mammography screening, menopause, adequate intake of calcium and vitamin D, diet and exercise     F/U  No follow-ups on file.  Lenna Hagarty B. Cambre Matson, PA-C 11/03/2022 12:24 PM

## 2022-11-04 ENCOUNTER — Other Ambulatory Visit (HOSPITAL_COMMUNITY)
Admission: RE | Admit: 2022-11-04 | Discharge: 2022-11-04 | Disposition: A | Discharge status: Home / Self Care | Payer: BC Managed Care – PPO | Source: Ambulatory Visit | Attending: Obstetrics and Gynecology | Admitting: Obstetrics and Gynecology

## 2022-11-04 ENCOUNTER — Ambulatory Visit (INDEPENDENT_AMBULATORY_CARE_PROVIDER_SITE_OTHER): Payer: BC Managed Care – PPO | Admitting: Obstetrics and Gynecology

## 2022-11-04 ENCOUNTER — Encounter: Payer: Self-pay | Admitting: Obstetrics and Gynecology

## 2022-11-04 VITALS — BP 116/66 | Ht 66.0 in | Wt 154.0 lb

## 2022-11-04 DIAGNOSIS — Z124 Encounter for screening for malignant neoplasm of cervix: Secondary | ICD-10-CM

## 2022-11-04 DIAGNOSIS — Z79890 Hormone replacement therapy (postmenopausal): Secondary | ICD-10-CM

## 2022-11-04 DIAGNOSIS — Z1211 Encounter for screening for malignant neoplasm of colon: Secondary | ICD-10-CM

## 2022-11-04 DIAGNOSIS — R1031 Right lower quadrant pain: Secondary | ICD-10-CM

## 2022-11-04 DIAGNOSIS — Z01419 Encounter for gynecological examination (general) (routine) without abnormal findings: Secondary | ICD-10-CM

## 2022-11-04 DIAGNOSIS — Z1151 Encounter for screening for human papillomavirus (HPV): Secondary | ICD-10-CM | POA: Diagnosis present

## 2022-11-04 DIAGNOSIS — N644 Mastodynia: Secondary | ICD-10-CM

## 2022-11-04 DIAGNOSIS — Z1231 Encounter for screening mammogram for malignant neoplasm of breast: Secondary | ICD-10-CM

## 2022-11-04 DIAGNOSIS — E28319 Asymptomatic premature menopause: Secondary | ICD-10-CM

## 2022-11-04 MED ORDER — ESTRADIOL 1 MG PO TABS: 1.0000 mg | ORAL_TABLET | Freq: Every day | ORAL | 3 refills | Status: DC

## 2022-11-04 NOTE — Patient Instructions (Addendum)
I value your feedback and you entrusting us with your care. If you get a [facility] patient survey, I would appreciate you taking the time to let us know about your experience today. Thank you!  [facility]: [phone]      

## 2022-11-06 LAB — CYTOLOGY - PAP
Adequacy: ABSENT
Comment: NEGATIVE
Diagnosis: NEGATIVE
High risk HPV: NEGATIVE

## 2022-11-12 ENCOUNTER — Ambulatory Visit
Admission: RE | Admit: 2022-11-12 | Discharge: 2022-11-12 | Disposition: A | Discharge status: Home / Self Care | Payer: BC Managed Care – PPO | Source: Ambulatory Visit | Attending: Obstetrics and Gynecology | Admitting: Obstetrics and Gynecology

## 2022-11-12 ENCOUNTER — Other Ambulatory Visit: Payer: BC Managed Care – PPO

## 2022-11-12 DIAGNOSIS — N644 Mastodynia: Secondary | ICD-10-CM | POA: Diagnosis present

## 2022-11-12 DIAGNOSIS — Z1231 Encounter for screening mammogram for malignant neoplasm of breast: Secondary | ICD-10-CM | POA: Insufficient documentation

## 2022-12-02 ENCOUNTER — Other Ambulatory Visit: Payer: Self-pay | Admitting: Otolaryngology

## 2022-12-02 DIAGNOSIS — E041 Nontoxic single thyroid nodule: Secondary | ICD-10-CM

## 2022-12-26 ENCOUNTER — Ambulatory Visit
Admission: RE | Admit: 2022-12-26 | Discharge: 2022-12-26 | Disposition: A | Discharge status: Home / Self Care | Payer: BC Managed Care – PPO | Source: Ambulatory Visit | Attending: Otolaryngology | Admitting: Otolaryngology

## 2022-12-26 DIAGNOSIS — E041 Nontoxic single thyroid nodule: Secondary | ICD-10-CM

## 2023-01-20 ENCOUNTER — Telehealth: Payer: BC Managed Care – PPO | Admitting: Nurse Practitioner

## 2023-01-20 DIAGNOSIS — Z20818 Contact with and (suspected) exposure to other bacterial communicable diseases: Secondary | ICD-10-CM

## 2023-01-20 DIAGNOSIS — J029 Acute pharyngitis, unspecified: Secondary | ICD-10-CM

## 2023-01-20 MED ORDER — AMOXICILLIN 500 MG PO CAPS: 500.0000 mg | ORAL_CAPSULE | Freq: Two times a day (BID) | ORAL | 0 refills | Status: AC

## 2023-01-20 NOTE — Progress Notes (Signed)
E-Visit for Sore Throat - Strep Symptoms  We are sorry that you are not feeling well.  Here is how we plan to help!  Based on what you have shared with me it is likely that you have strep pharyngitis.  Strep pharyngitis is inflammation and infection in the back of the throat.  This is an infection cause by bacteria and is treated with antibiotics.  I have prescribed Amoxicillin 500 mg twice a day for 10 days. For throat pain, we recommend over the counter oral pain relief medications such as acetaminophen or aspirin, or anti-inflammatory medications such as ibuprofen or naproxen sodium. Topical treatments such as oral throat lozenges or sprays may be used as needed. Strep infections are not as easily transmitted as other respiratory infections, however we still recommend that you avoid close contact with loved ones, especially the very young and elderly.  Remember to wash your hands thoroughly throughout the day as this is the number one way to prevent the spread of infection and wipe down door knobs and counters with disinfectant.   Home Care: Only take medications as instructed by your medical team. Complete the entire course of an antibiotic. Do not take these medications with alcohol. A steam or ultrasonic humidifier can help congestion.  You can place a towel over your head and breathe in the steam from hot water coming from a faucet. Avoid close contacts especially the very young and the elderly. Cover your mouth when you cough or sneeze. Always remember to wash your hands.  Get Help Right Away If: You develop worsening fever or sinus pain. You develop a severe head ache or visual changes. Your symptoms persist after you have completed your treatment plan.  Make sure you Understand these instructions. Will watch your condition. Will get help right away if you are not doing well or get worse.   Thank you for choosing an e-visit.  Your e-visit answers were reviewed by a board  certified advanced clinical practitioner to complete your personal care plan. Depending upon the condition, your plan could have included both over the counter or prescription medications.  Please review your pharmacy choice. Make sure the pharmacy is open so you can pick up prescription now. If there is a problem, you may contact your provider through MyChart messaging and have the prescription routed to another pharmacy.  Your safety is important to us. If you have drug allergies check your prescription carefully.   For the next 24 hours you can use MyChart to ask questions about today's visit, request a non-urgent call back, or ask for a work or school excuse. You will get an email in the next two days asking about your experience. I hope that your e-visit has been valuable and will speed your recovery.   Meds ordered this encounter  Medications   amoxicillin (AMOXIL) 500 MG capsule    Sig: Take 1 capsule (500 mg total) by mouth 2 (two) times daily for 10 days.    Dispense:  20 capsule    Refill:  0     I spent approximately 5 minutes reviewing the patient's history, current symptoms and coordinating their care today.   

## 2023-01-29 ENCOUNTER — Telehealth: Payer: Self-pay

## 2023-01-29 NOTE — Telephone Encounter (Signed)
I am willing to change it for her but a screening code was already used. If she can find out the specific code they referenced, I will change it for her and we can retry it.

## 2023-01-29 NOTE — Telephone Encounter (Signed)
Pt said they mentioned something like "precautionary code". She will call them back in a little bit.

## 2023-01-29 NOTE — Telephone Encounter (Signed)
Insurance often doesn't cover ultrasounds at 100% (even if done at annual mammogram) because they are done due to a problem (pain in her case) and not done for screening. The screening code was used in conjunction with the pain diagnosis code. Insurance often times applies to deductible, and therefore, radiology charge gets applied as well. I can't change code to get it paid, unfortunately.

## 2023-01-29 NOTE — Telephone Encounter (Signed)
Patient called and stated that her left breast ultrasound that was done for left breast pain was not covered by her insurance, because of the codes used. She said that the insurance company said if you can code it differently and resubmit it they would cover it. The code should be precautionary. She couldn't remember exactly what code they gave her. She also got a bill from the radiologist that was not covered for 11/12/2022. Please advise.  CB

## 2023-04-06 ENCOUNTER — Other Ambulatory Visit: Payer: Self-pay

## 2023-04-06 ENCOUNTER — Encounter: Payer: Self-pay | Admitting: Obstetrics and Gynecology

## 2023-04-06 DIAGNOSIS — Z79890 Hormone replacement therapy (postmenopausal): Secondary | ICD-10-CM

## 2023-04-06 DIAGNOSIS — E28319 Asymptomatic premature menopause: Secondary | ICD-10-CM

## 2023-04-06 MED ORDER — ESTRADIOL 1 MG PO TABS
1.0000 mg | ORAL_TABLET | Freq: Every day | ORAL | 0 refills | Status: DC
Start: 2023-04-06 — End: 2023-07-27

## 2023-06-06 IMAGING — MG MM DIGITAL SCREENING BILAT W/ TOMO AND CAD
8 series · 9 of 24 positions shown · non-contrast
Comparison: Previous exam(s).

CLINICAL DATA: Screening.

EXAM:
DIGITAL SCREENING BILATERAL MAMMOGRAM WITH TOMOSYNTHESIS AND CAD
TECHNIQUE: Bilateral screening digital craniocaudal and mediolateral oblique
mammograms were obtained. Bilateral screening digital breast
tomosynthesis was performed. The images were evaluated with
computer-aided detection.

[R MLO synth-2D]
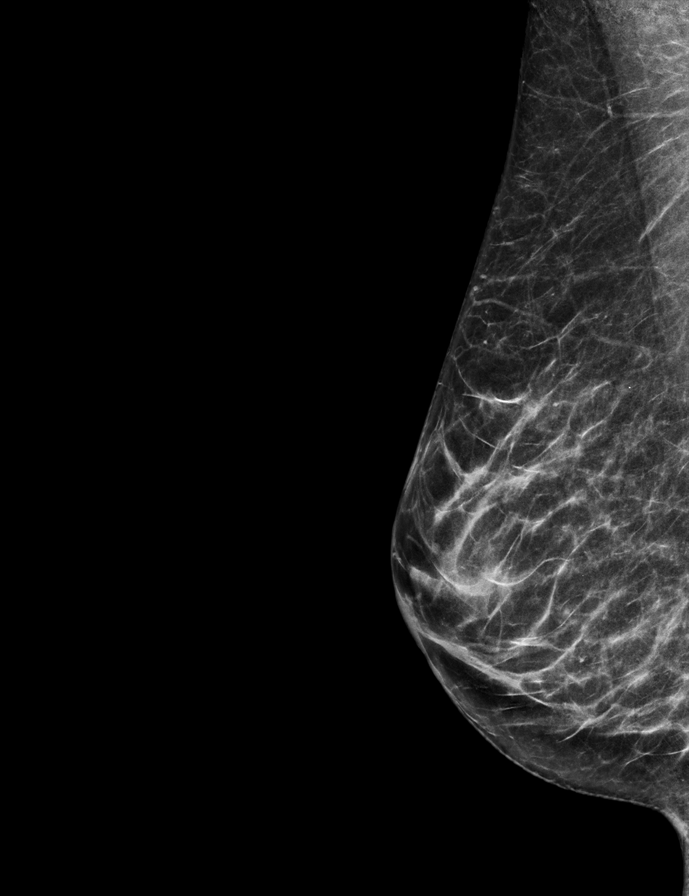

[L CC synth-2D]
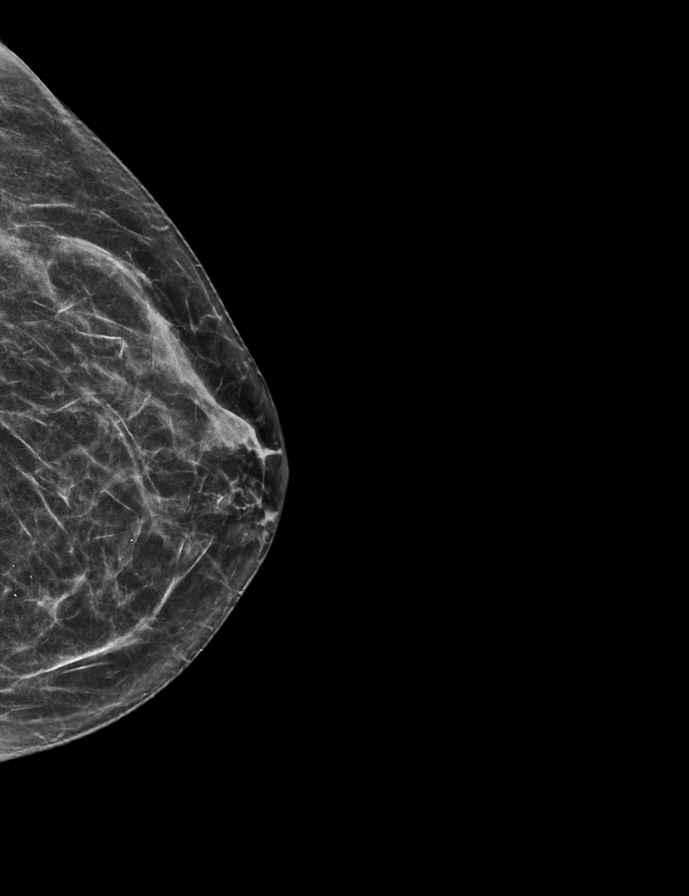

[R CC synth-2D]
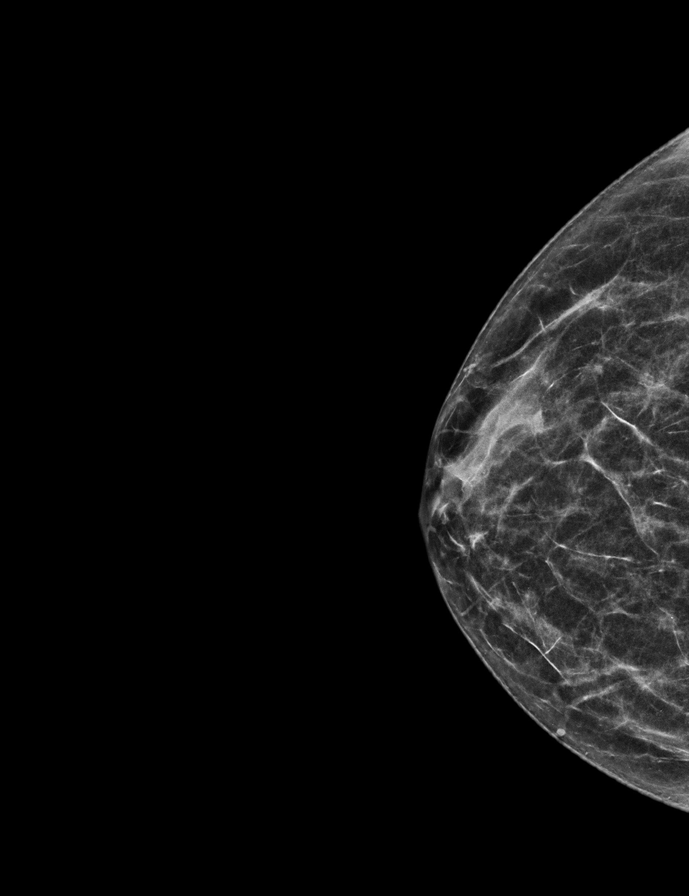

[L MLO synth-2D]
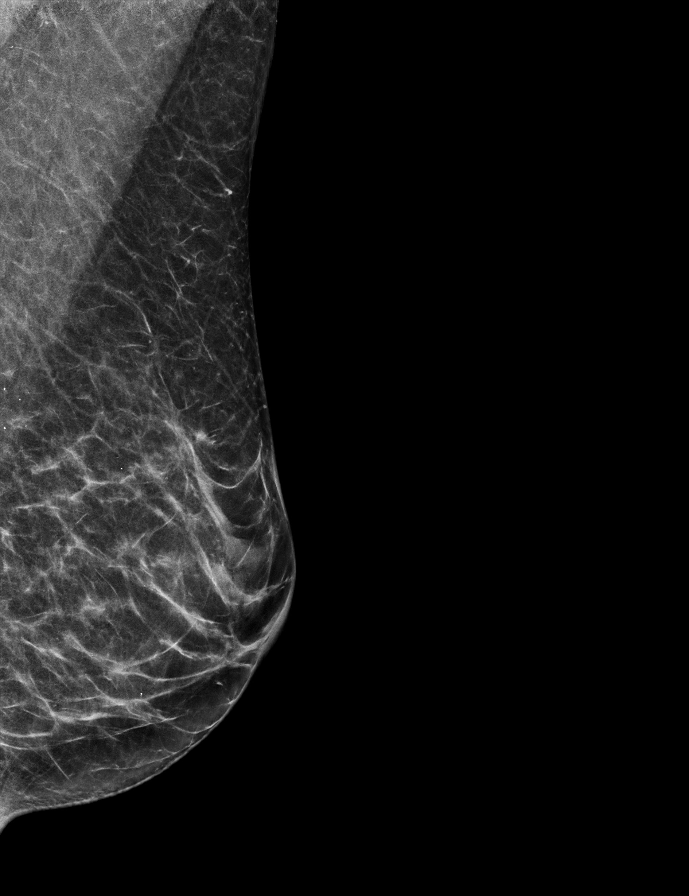

[L MLO tomo · 2 of 57 frames shown]
[frame 19/57]
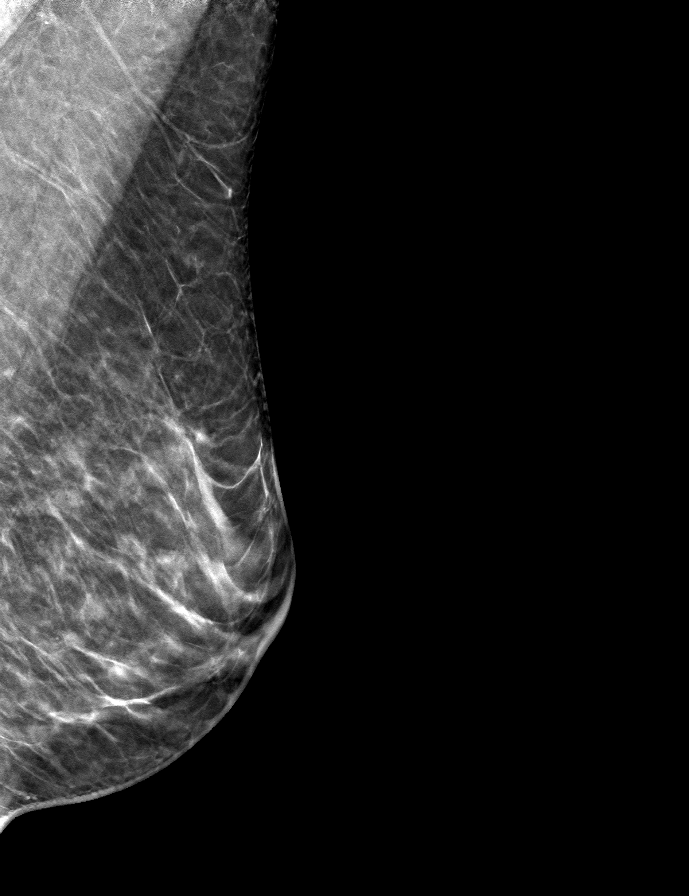
[frame 29/57]
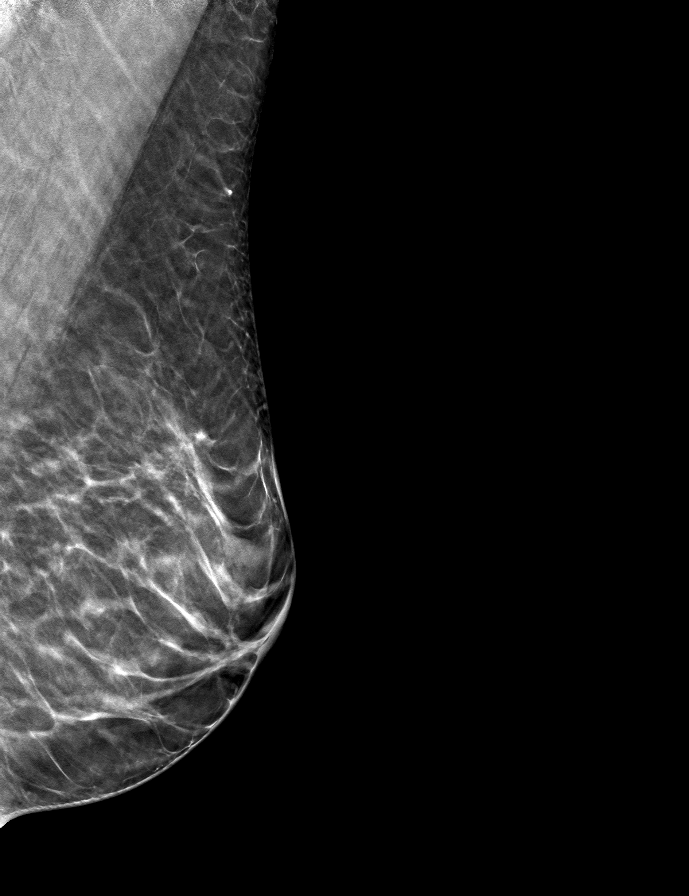

[L CC tomo · tomo slice 29/56.0]
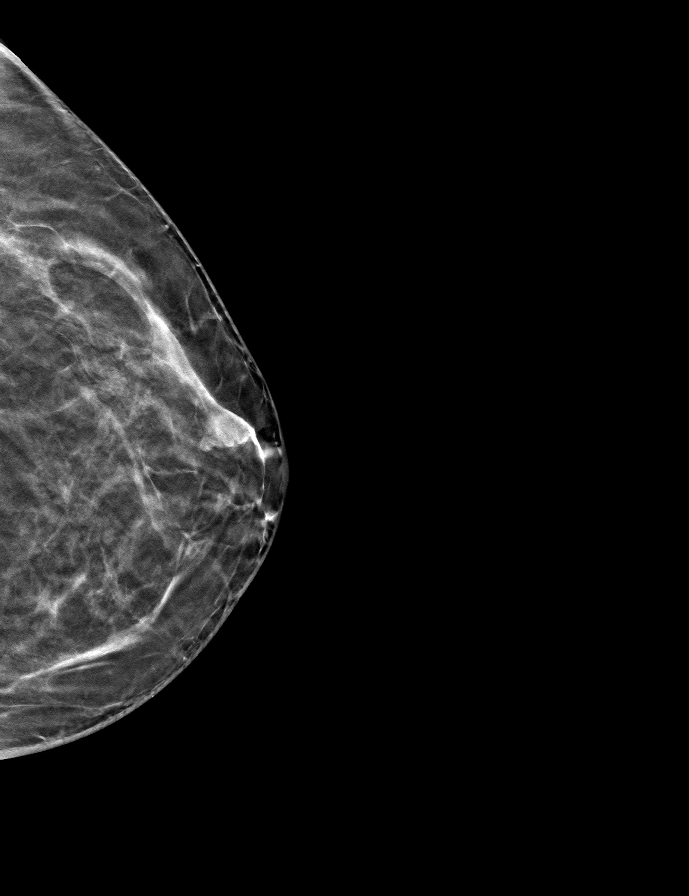

[R MLO tomo · tomo slice 29/58.0]
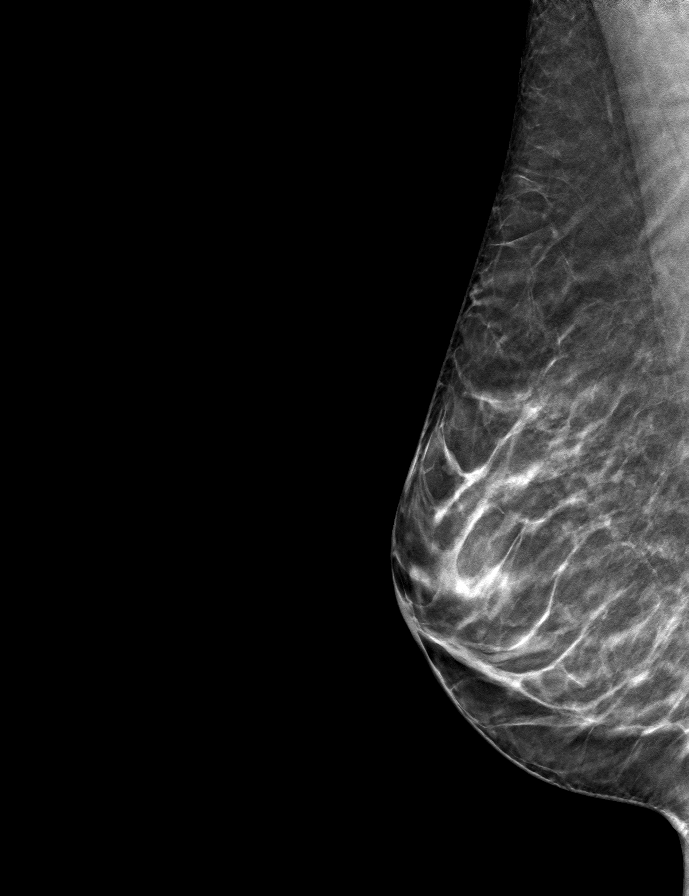

[R CC tomo · tomo slice 25/50.0]
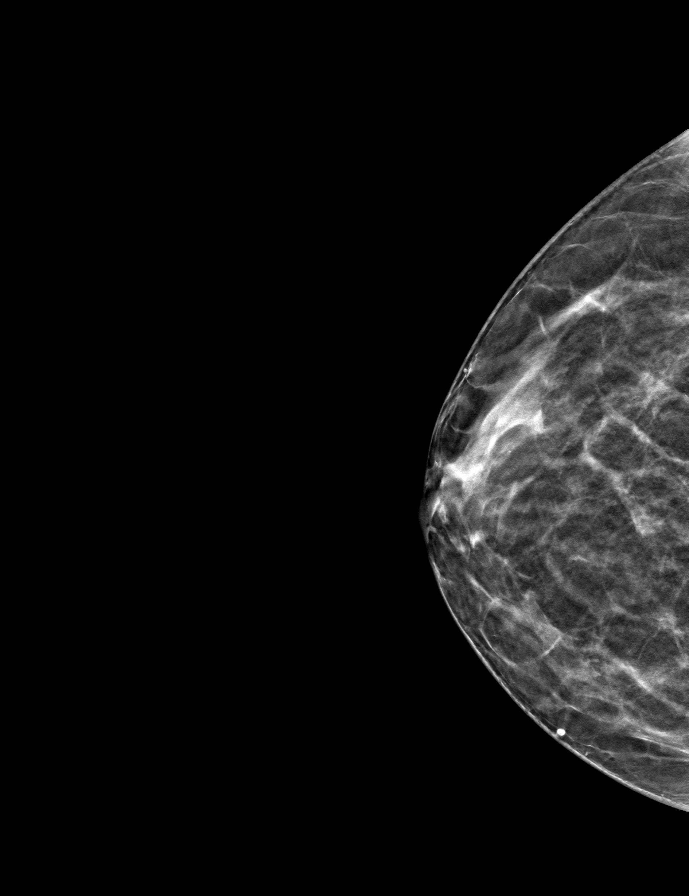

[9 of 24 positions shown; findings below may reference images not displayed]

ACR Breast Density Category c: The breast tissue is heterogeneously
dense, which may obscure small masses.
FINDINGS: There are no findings suspicious for malignancy.
IMPRESSION: No mammographic evidence of malignancy. A result letter of this
screening mammogram will be mailed directly to the patient.

RECOMMENDATION:
Screening mammogram in one year. (Code:Q3-W-BC3)

BI-RADS CATEGORY  1: Negative.

## 2023-07-27 ENCOUNTER — Other Ambulatory Visit: Payer: Self-pay | Admitting: Obstetrics and Gynecology

## 2023-07-27 DIAGNOSIS — Z79890 Hormone replacement therapy (postmenopausal): Secondary | ICD-10-CM

## 2023-07-27 DIAGNOSIS — E28319 Asymptomatic premature menopause: Secondary | ICD-10-CM

## 2023-07-27 MED ORDER — ESTRADIOL 1 MG PO TABS
1.0000 mg | ORAL_TABLET | Freq: Every day | ORAL | 1 refills | Status: AC
Start: 2023-07-27 — End: ?

## 2023-11-04 ENCOUNTER — Encounter: Payer: Self-pay | Admitting: Obstetrics and Gynecology
